# Patient Record
Sex: Female | Born: 1975 | Race: White | Hispanic: No | Marital: Married | Smoking: Former smoker
Health system: Southern US, Community
[De-identification: ages and names within clinical notes are randomized; demographics above are authoritative.]

## PROBLEM LIST (undated history)

## (undated) DIAGNOSIS — I10 Essential (primary) hypertension: Secondary | ICD-10-CM

## (undated) DIAGNOSIS — Z9884 Bariatric surgery status: Secondary | ICD-10-CM

## (undated) DIAGNOSIS — G932 Benign intracranial hypertension: Secondary | ICD-10-CM

## (undated) DIAGNOSIS — E785 Hyperlipidemia, unspecified: Secondary | ICD-10-CM

## (undated) DIAGNOSIS — Z8669 Personal history of other diseases of the nervous system and sense organs: Secondary | ICD-10-CM

## (undated) HISTORY — DX: Benign intracranial hypertension: G93.2

## (undated) HISTORY — DX: Personal history of other diseases of the nervous system and sense organs: Z86.69

## (undated) HISTORY — DX: Hyperlipidemia, unspecified: E78.5

## (undated) HISTORY — DX: Essential (primary) hypertension: I10

## (undated) HISTORY — DX: Bariatric surgery status: Z98.84

---

## 2003-08-16 ENCOUNTER — Other Ambulatory Visit: Admission: RE | Admit: 2003-08-16 | Discharge: 2003-08-16 | Payer: Self-pay | Admitting: Obstetrics and Gynecology

## 2004-03-26 ENCOUNTER — Ambulatory Visit: Payer: Self-pay | Admitting: Internal Medicine

## 2004-05-14 ENCOUNTER — Ambulatory Visit: Payer: Self-pay | Admitting: Internal Medicine

## 2004-05-21 ENCOUNTER — Ambulatory Visit: Payer: Self-pay | Admitting: Internal Medicine

## 2004-08-17 ENCOUNTER — Ambulatory Visit: Payer: Self-pay | Admitting: Internal Medicine

## 2005-04-08 ENCOUNTER — Inpatient Hospital Stay (HOSPITAL_COMMUNITY): Admission: AD | Admit: 2005-04-08 | Discharge: 2005-04-08 | Payer: Self-pay | Admitting: *Deleted

## 2005-04-19 ENCOUNTER — Inpatient Hospital Stay (HOSPITAL_COMMUNITY): Admission: RE | Admit: 2005-04-19 | Discharge: 2005-04-21 | Payer: Self-pay | Admitting: *Deleted

## 2005-12-11 ENCOUNTER — Emergency Department (HOSPITAL_COMMUNITY): Admission: EM | Admit: 2005-12-11 | Discharge: 2005-12-11 | Payer: Self-pay | Admitting: Emergency Medicine

## 2006-03-17 ENCOUNTER — Encounter: Admission: RE | Admit: 2006-03-17 | Discharge: 2006-03-17 | Payer: Self-pay | Admitting: Internal Medicine

## 2006-03-17 ENCOUNTER — Ambulatory Visit: Payer: Self-pay | Admitting: Internal Medicine

## 2006-03-25 ENCOUNTER — Encounter: Payer: Self-pay | Admitting: Internal Medicine

## 2006-04-09 ENCOUNTER — Encounter: Admission: RE | Admit: 2006-04-09 | Discharge: 2006-04-09 | Payer: Self-pay | Admitting: Neurology

## 2006-04-14 ENCOUNTER — Ambulatory Visit (HOSPITAL_COMMUNITY): Admission: RE | Admit: 2006-04-14 | Discharge: 2006-04-14 | Payer: Self-pay | Admitting: Neurology

## 2006-06-10 ENCOUNTER — Ambulatory Visit: Payer: Self-pay | Admitting: Internal Medicine

## 2006-06-12 ENCOUNTER — Encounter: Payer: Self-pay | Admitting: Internal Medicine

## 2006-08-05 ENCOUNTER — Ambulatory Visit: Payer: Self-pay | Admitting: Internal Medicine

## 2006-08-05 LAB — CONVERTED CEMR LAB
Albumin: 3.7 g/dL (ref 3.5–5.2)
Alkaline Phosphatase: 60 units/L (ref 39–117)
BUN: 10 mg/dL (ref 6–23)
Basophils Absolute: 0 10*3/uL (ref 0.0–0.1)
Cholesterol: 141 mg/dL (ref 0–200)
Eosinophils Absolute: 0.1 10*3/uL (ref 0.0–0.6)
GFR calc Af Amer: 108 mL/min
GFR calc non Af Amer: 89 mL/min
HDL: 33.7 mg/dL — ABNORMAL LOW (ref >39.0)
Hemoglobin: 14 g/dL (ref 12.0–15.0)
Lymphocytes Relative: 31.3 % (ref 12.0–46.0)
MCHC: 33.7 g/dL (ref 30.0–36.0)
Monocytes Absolute: 0.5 10*3/uL (ref 0.2–0.7)
Monocytes Relative: 7.5 % (ref 3.0–11.0)
Neutro Abs: 4.2 10*3/uL (ref 1.4–7.7)
Platelets: 234 10*3/uL (ref 150–400)
Potassium: 3.9 meq/L (ref 3.5–5.1)
Total Protein: 6.8 g/dL (ref 6.0–8.3)
Triglycerides: 74 mg/dL (ref 0–149)

## 2006-08-12 ENCOUNTER — Ambulatory Visit: Payer: Self-pay | Admitting: Internal Medicine

## 2006-09-18 ENCOUNTER — Encounter: Payer: Self-pay | Admitting: Internal Medicine

## 2006-10-07 ENCOUNTER — Ambulatory Visit: Payer: Self-pay | Admitting: Family Medicine

## 2006-12-30 ENCOUNTER — Ambulatory Visit: Payer: Self-pay | Admitting: Internal Medicine

## 2006-12-31 LAB — CONVERTED CEMR LAB
Cholesterol: 174 mg/dL (ref 0–200)
HDL: 33.7 mg/dL — ABNORMAL LOW (ref >39.0)
LDL Cholesterol: 115 mg/dL — ABNORMAL HIGH (ref 0–99)
Total CHOL/HDL Ratio: 5.2
Triglycerides: 129 mg/dL (ref 0–149)

## 2007-01-01 DIAGNOSIS — I10 Essential (primary) hypertension: Secondary | ICD-10-CM | POA: Insufficient documentation

## 2007-01-01 DIAGNOSIS — F329 Major depressive disorder, single episode, unspecified: Secondary | ICD-10-CM | POA: Insufficient documentation

## 2007-01-06 ENCOUNTER — Ambulatory Visit: Payer: Self-pay | Admitting: Internal Medicine

## 2007-01-06 DIAGNOSIS — E785 Hyperlipidemia, unspecified: Secondary | ICD-10-CM | POA: Insufficient documentation

## 2007-10-23 ENCOUNTER — Inpatient Hospital Stay (HOSPITAL_COMMUNITY): Admission: AD | Admit: 2007-10-23 | Discharge: 2007-10-26 | Payer: Self-pay | Admitting: Obstetrics and Gynecology

## 2007-10-23 ENCOUNTER — Encounter (INDEPENDENT_AMBULATORY_CARE_PROVIDER_SITE_OTHER): Payer: Self-pay | Admitting: *Deleted

## 2008-01-07 ENCOUNTER — Ambulatory Visit: Payer: Self-pay | Admitting: Internal Medicine

## 2008-01-09 ENCOUNTER — Observation Stay (HOSPITAL_COMMUNITY): Admission: EM | Admit: 2008-01-09 | Discharge: 2008-01-10 | Payer: Self-pay | Admitting: Emergency Medicine

## 2008-01-11 ENCOUNTER — Ambulatory Visit (HOSPITAL_COMMUNITY): Admission: RE | Admit: 2008-01-11 | Discharge: 2008-01-11 | Payer: Self-pay | Admitting: General Surgery

## 2008-01-11 LAB — CONVERTED CEMR LAB
ALT: 20 units/L (ref 0–35)
Alkaline Phosphatase: 63 units/L (ref 39–117)
Bilirubin, Direct: 0.1 mg/dL (ref 0.0–0.3)
CO2: 25 meq/L (ref 19–32)
Calcium: 9.1 mg/dL (ref 8.4–10.5)
Cholesterol: 268 mg/dL (ref 0–200)
Glucose, Bld: 81 mg/dL (ref 70–99)
Hemoglobin: 13.8 g/dL (ref 12.0–15.0)
Lymphocytes Relative: 33.7 % (ref 12.0–46.0)
Monocytes Relative: 6.1 % (ref 3.0–12.0)
Neutro Abs: 4.2 10*3/uL (ref 1.4–7.7)
Potassium: 4 meq/L (ref 3.5–5.1)
RDW: 13.4 % (ref 11.5–14.6)
Sodium: 139 meq/L (ref 135–145)
Total CHOL/HDL Ratio: 6.5
Total Protein: 7.3 g/dL (ref 6.0–8.3)
VLDL: 30 mg/dL (ref 0–40)
WBC: 7.4 10*3/uL (ref 4.5–10.5)

## 2008-01-18 ENCOUNTER — Encounter: Payer: Self-pay | Admitting: Internal Medicine

## 2008-02-04 ENCOUNTER — Telehealth: Payer: Self-pay | Admitting: *Deleted

## 2008-02-21 HISTORY — PX: CHOLECYSTECTOMY: SHX55

## 2008-03-15 ENCOUNTER — Ambulatory Visit: Payer: Self-pay | Admitting: Internal Medicine

## 2008-03-15 DIAGNOSIS — K8020 Calculus of gallbladder without cholecystitis without obstruction: Secondary | ICD-10-CM | POA: Insufficient documentation

## 2008-03-16 ENCOUNTER — Encounter: Payer: Self-pay | Admitting: Internal Medicine

## 2008-03-28 ENCOUNTER — Ambulatory Visit (HOSPITAL_COMMUNITY): Admission: RE | Admit: 2008-03-28 | Discharge: 2008-03-28 | Payer: Self-pay | Admitting: General Surgery

## 2008-03-28 ENCOUNTER — Encounter (INDEPENDENT_AMBULATORY_CARE_PROVIDER_SITE_OTHER): Payer: Self-pay | Admitting: General Surgery

## 2008-04-21 ENCOUNTER — Encounter: Payer: Self-pay | Admitting: Internal Medicine

## 2008-04-28 ENCOUNTER — Encounter: Payer: Self-pay | Admitting: Internal Medicine

## 2008-05-19 ENCOUNTER — Encounter: Payer: Self-pay | Admitting: Internal Medicine

## 2008-08-10 ENCOUNTER — Encounter: Payer: Self-pay | Admitting: Internal Medicine

## 2008-08-16 ENCOUNTER — Encounter: Payer: Self-pay | Admitting: Internal Medicine

## 2008-08-18 ENCOUNTER — Ambulatory Visit: Payer: Self-pay | Admitting: Internal Medicine

## 2008-08-18 DIAGNOSIS — E669 Obesity, unspecified: Secondary | ICD-10-CM | POA: Insufficient documentation

## 2008-08-18 DIAGNOSIS — G932 Benign intracranial hypertension: Secondary | ICD-10-CM | POA: Insufficient documentation

## 2008-08-18 DIAGNOSIS — R252 Cramp and spasm: Secondary | ICD-10-CM | POA: Insufficient documentation

## 2008-08-18 LAB — CONVERTED CEMR LAB
AST: 23 units/L (ref 0–37)
Alkaline Phosphatase: 76 units/L (ref 39–117)
BUN: 9 mg/dL (ref 6–23)
Basophils Absolute: 0 10*3/uL (ref 0.0–0.1)
Basophils Relative: 0.4 % (ref 0.0–3.0)
CO2: 25 meq/L (ref 19–32)
Chloride: 107 meq/L (ref 96–112)
Creatinine, Ser: 0.7 mg/dL (ref 0.4–1.2)
HCT: 40.4 % (ref 36.0–46.0)
Hemoglobin: 14.1 g/dL (ref 12.0–15.0)
Lymphs Abs: 2.5 10*3/uL (ref 0.7–4.0)
MCHC: 34.9 g/dL (ref 30.0–36.0)
Monocytes Relative: 5.6 % (ref 3.0–12.0)
Neutro Abs: 5.7 10*3/uL (ref 1.4–7.7)
RDW: 12.8 % (ref 11.5–14.6)
TSH: 2.32 microintl units/mL (ref 0.35–5.50)
Total Bilirubin: 0.7 mg/dL (ref 0.3–1.2)
Total CHOL/HDL Ratio: 5

## 2008-08-19 ENCOUNTER — Encounter: Payer: Self-pay | Admitting: Internal Medicine

## 2008-08-23 ENCOUNTER — Encounter: Payer: Self-pay | Admitting: Internal Medicine

## 2008-08-25 ENCOUNTER — Telehealth: Payer: Self-pay | Admitting: *Deleted

## 2008-09-13 ENCOUNTER — Ambulatory Visit: Payer: Self-pay | Admitting: Internal Medicine

## 2008-09-13 ENCOUNTER — Telehealth: Payer: Self-pay | Admitting: Internal Medicine

## 2008-09-13 LAB — CONVERTED CEMR LAB
BUN: 12 mg/dL (ref 6–23)
CO2: 23 meq/L (ref 19–32)
GFR calc non Af Amer: 102.09 mL/min (ref >60)
Glucose, Bld: 88 mg/dL (ref 70–99)
Potassium: 3.7 meq/L (ref 3.5–5.1)

## 2008-09-15 ENCOUNTER — Encounter: Payer: Self-pay | Admitting: Internal Medicine

## 2008-09-16 ENCOUNTER — Ambulatory Visit: Payer: Self-pay | Admitting: Internal Medicine

## 2008-09-23 ENCOUNTER — Encounter: Payer: Self-pay | Admitting: Internal Medicine

## 2008-10-07 ENCOUNTER — Ambulatory Visit (HOSPITAL_COMMUNITY): Admission: RE | Admit: 2008-10-07 | Discharge: 2008-10-07 | Payer: Self-pay | Admitting: Surgery

## 2008-10-11 ENCOUNTER — Ambulatory Visit (HOSPITAL_COMMUNITY): Admission: RE | Admit: 2008-10-11 | Discharge: 2008-10-11 | Payer: Self-pay | Admitting: Surgery

## 2008-10-14 ENCOUNTER — Ambulatory Visit: Payer: Self-pay | Admitting: Internal Medicine

## 2008-10-14 DIAGNOSIS — R030 Elevated blood-pressure reading, without diagnosis of hypertension: Secondary | ICD-10-CM | POA: Insufficient documentation

## 2008-10-19 ENCOUNTER — Encounter: Admission: RE | Admit: 2008-10-19 | Discharge: 2009-01-17 | Payer: Self-pay | Admitting: Surgery

## 2008-10-20 ENCOUNTER — Encounter: Payer: Self-pay | Admitting: Internal Medicine

## 2008-10-22 ENCOUNTER — Inpatient Hospital Stay (HOSPITAL_COMMUNITY): Admission: AD | Admit: 2008-10-22 | Discharge: 2008-10-23 | Payer: Self-pay | Admitting: Obstetrics and Gynecology

## 2008-11-18 ENCOUNTER — Ambulatory Visit: Payer: Self-pay | Admitting: Internal Medicine

## 2008-12-16 ENCOUNTER — Ambulatory Visit: Payer: Self-pay | Admitting: Internal Medicine

## 2008-12-16 DIAGNOSIS — E559 Vitamin D deficiency, unspecified: Secondary | ICD-10-CM | POA: Insufficient documentation

## 2009-02-03 ENCOUNTER — Ambulatory Visit: Payer: Self-pay | Admitting: Internal Medicine

## 2009-02-07 LAB — CONVERTED CEMR LAB: Vit D, 25-Hydroxy: 37 ng/mL (ref 30–89)

## 2009-02-09 ENCOUNTER — Encounter: Admission: RE | Admit: 2009-02-09 | Discharge: 2009-04-19 | Payer: Self-pay | Admitting: Surgery

## 2009-03-01 ENCOUNTER — Ambulatory Visit: Payer: Self-pay | Admitting: Internal Medicine

## 2009-03-01 DIAGNOSIS — J351 Hypertrophy of tonsils: Secondary | ICD-10-CM | POA: Insufficient documentation

## 2009-03-01 LAB — CONVERTED CEMR LAB: Rapid Strep: NEGATIVE

## 2009-03-02 ENCOUNTER — Encounter (INDEPENDENT_AMBULATORY_CARE_PROVIDER_SITE_OTHER): Payer: Self-pay | Admitting: *Deleted

## 2009-03-07 ENCOUNTER — Ambulatory Visit (HOSPITAL_COMMUNITY): Admission: RE | Admit: 2009-03-07 | Discharge: 2009-03-07 | Payer: Self-pay | Admitting: Surgery

## 2009-03-07 HISTORY — PX: LAPAROSCOPIC GASTRIC BANDING: SHX1100

## 2009-05-04 ENCOUNTER — Encounter: Admission: RE | Admit: 2009-05-04 | Discharge: 2009-08-02 | Payer: Self-pay | Admitting: Surgery

## 2009-05-11 ENCOUNTER — Encounter: Payer: Self-pay | Admitting: Internal Medicine

## 2009-06-23 ENCOUNTER — Encounter: Payer: Self-pay | Admitting: Internal Medicine

## 2009-07-29 IMAGING — CT CT PELVIS W/ CM
2 of 5 series · 17 of 46 positions shown, 19 images · IV contrast (agent unspecified)
Comparison: None

CT ABDOMEN

CLINICAL DATA: Abdominal pain

CT ABDOMEN AND PELVIS WITH CONTRAST
TECHNIQUE: Multidetector CT imaging of the abdomen and pelvis was
performed using the standard protocol following bolus
administration of intravenous contrast.
Contrast:
100 ml of omni 300

[Series 2: abd_pel 5.0 b40s · axial · 0.84mm/px · z∈[+651,+1066]mm · 14 of 93 slices shown, 16 images]
[im 5/93  soft-tissue]
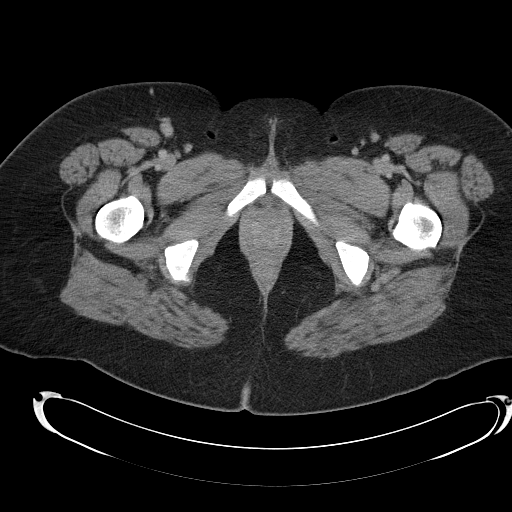
[im 5/93  bone]
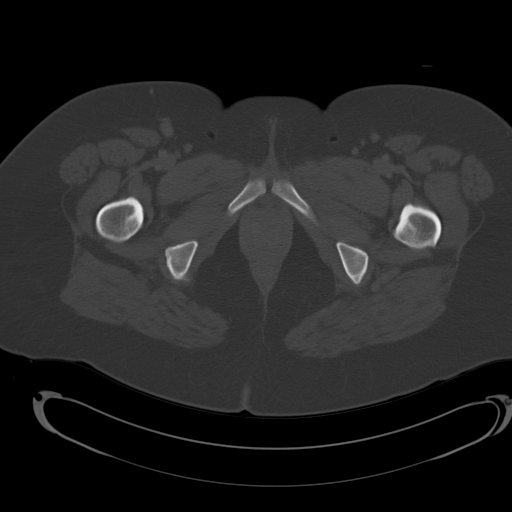
[im 14/93  soft-tissue]
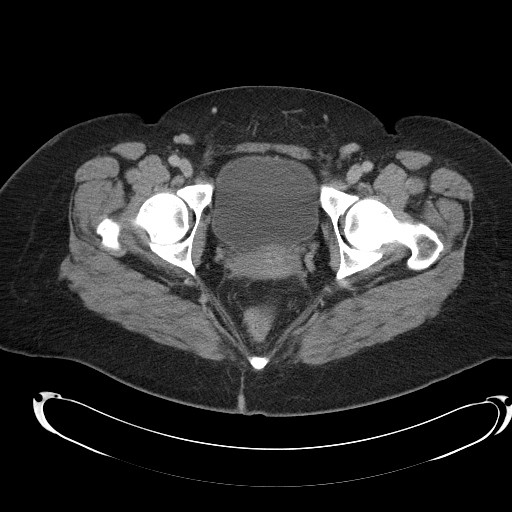
[im 19/93  soft-tissue]
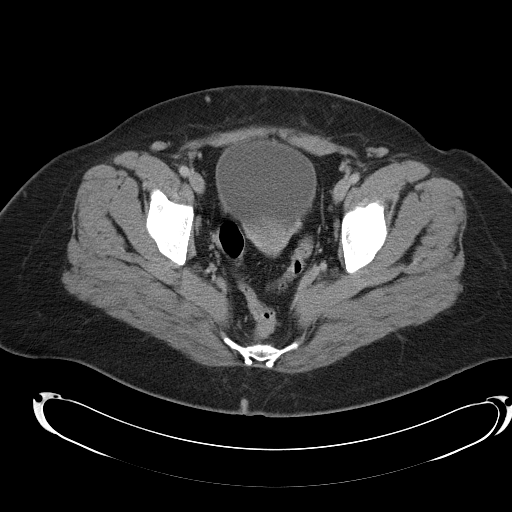
[im 24/93  soft-tissue]
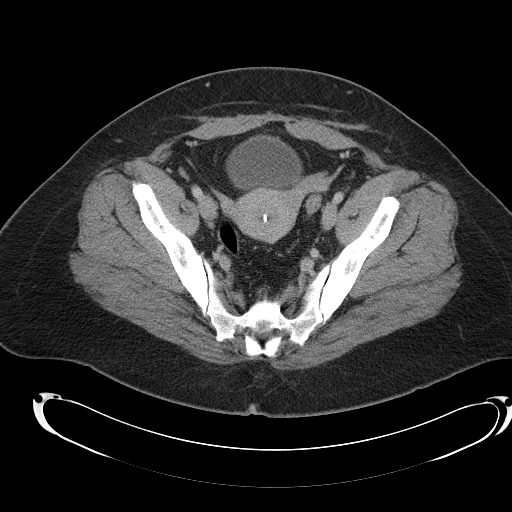
[im 33/93  soft-tissue]
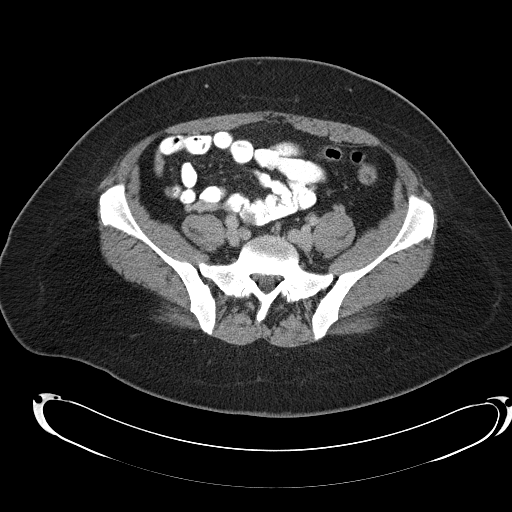
[im 37/93  soft-tissue]
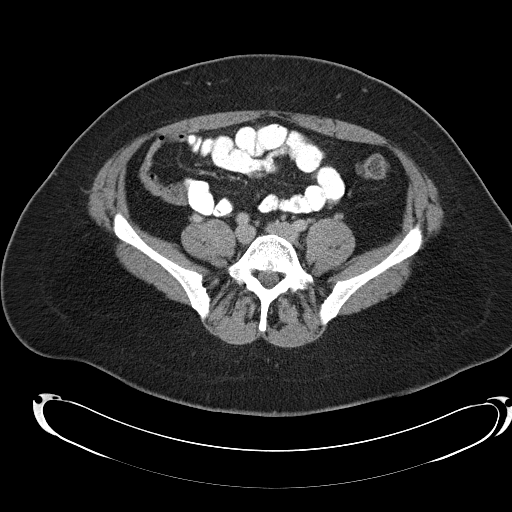
[im 42/93  soft-tissue]
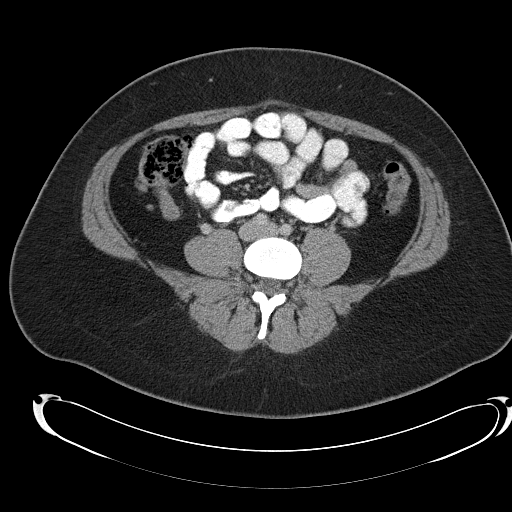
[im 51/93  soft-tissue]
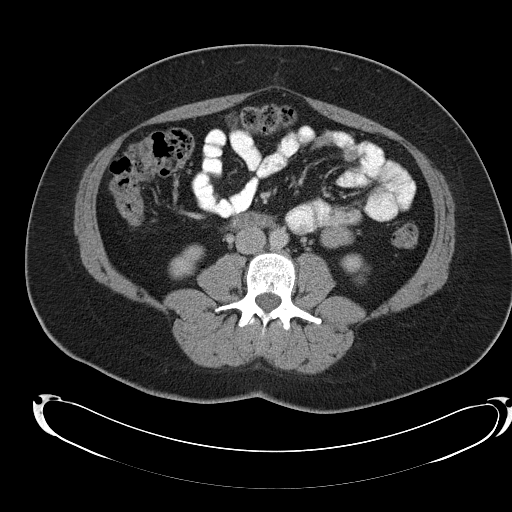
[im 56/93  soft-tissue]
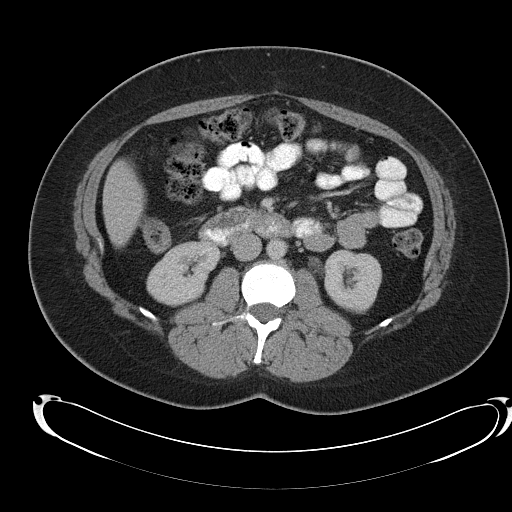
[im 56/93  bone]
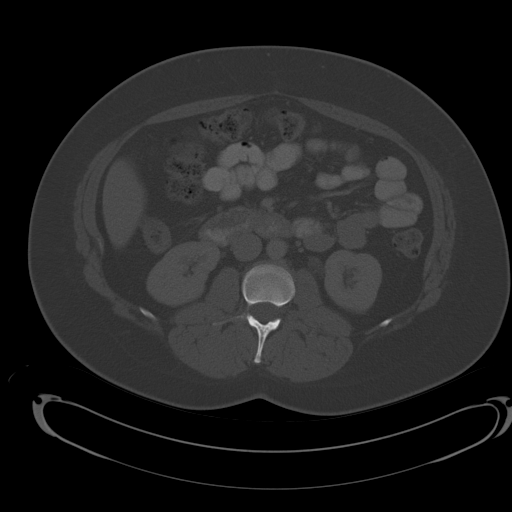
[im 60/93  soft-tissue]
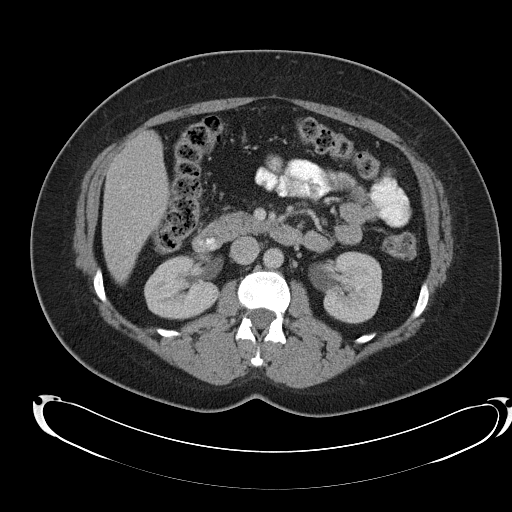
[im 70/93  soft-tissue]
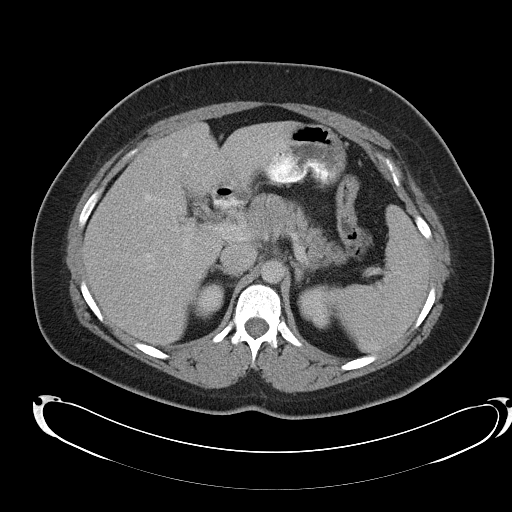
[im 74/93  soft-tissue]
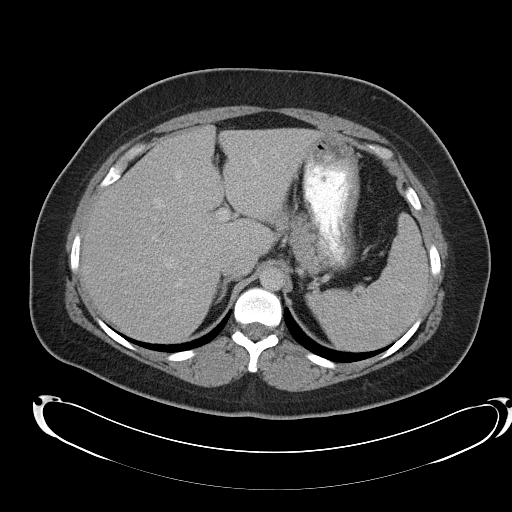
[im 79/93  soft-tissue]
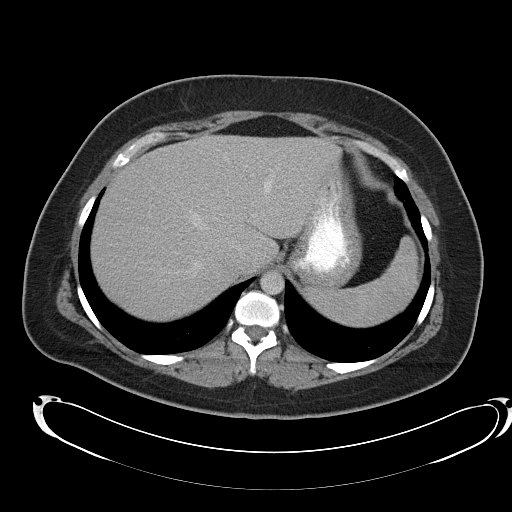
[im 88/93  soft-tissue]
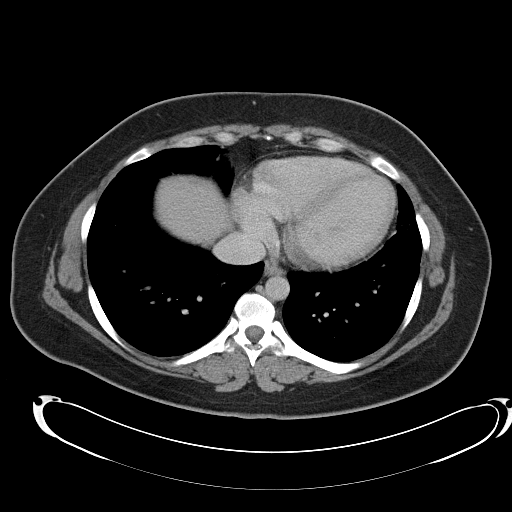

[Series 602: <mpr thick range> · coronal · 0.90mm/px · 3 of 90 slices shown]
[im 30/90  soft-tissue]
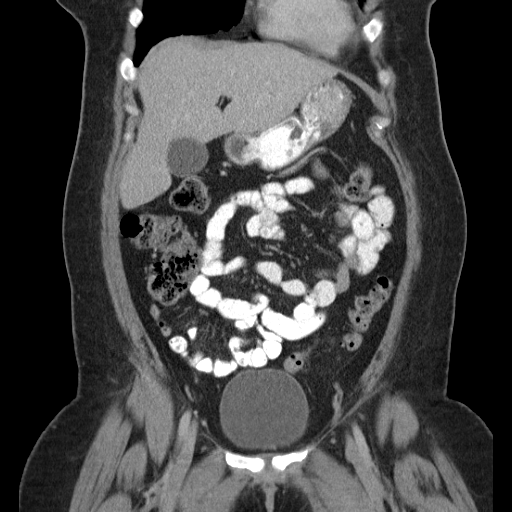
[im 40/90  soft-tissue]
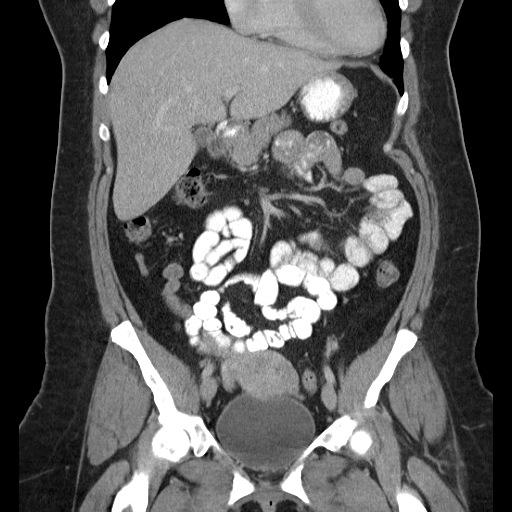
[im 50/90  soft-tissue]
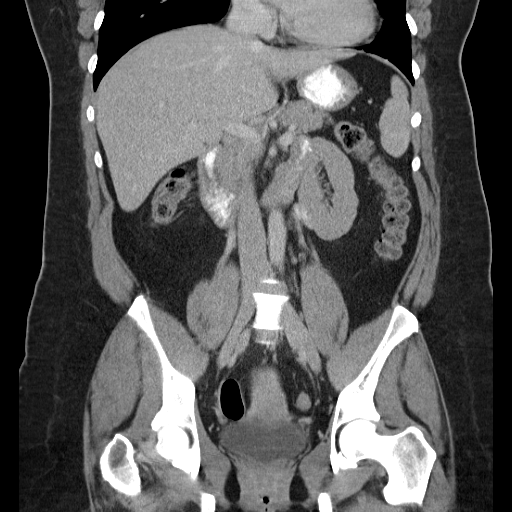

[17 of 46 positions shown; findings below may reference images not displayed]

FINDINGS: The lung bases are clear.

No pericardial or pleural fluid.

The liver parenchyma is normal.

The spleen is normal.

Both of the adrenal glands are normal.

The pancreas is normal.

The right kidney there is normal.

The left kidney is normal.

There is no enlarged retroperitoneal or small bowel mesenteric
lymph nodes.

The bowel loops of the upper abdomen are normal in their course and
caliber.

The appendix appears normal

The visualized osseous structures are unremarkable.
IMPRESSION: 1.  No acute upper abdominal CT findings.
2.  No mass or adenopathy.

CT PELVIS
FINDINGS: The urinary bladder is negative.

An IUD is identified within the uterine cavity.

The pelvic bowel loops are unremarkable.

No free fluid or abnormal fluid collections.

No mass or pelvic adenopathy.

The ventral abdominal and pelvic wall is intact.

No hernia is noted.
IMPRESSION: 1.  No acute pelvic CT findings.

## 2009-11-01 ENCOUNTER — Ambulatory Visit: Payer: Self-pay | Admitting: Internal Medicine

## 2009-11-01 LAB — CONVERTED CEMR LAB: Rapid Strep: POSITIVE

## 2010-01-02 ENCOUNTER — Ambulatory Visit: Payer: Self-pay | Admitting: Internal Medicine

## 2010-01-02 LAB — CONVERTED CEMR LAB
ALT: 12 units/L (ref 0–35)
AST: 16 units/L (ref 0–37)
Albumin: 3.9 g/dL (ref 3.5–5.2)
Alkaline Phosphatase: 42 units/L (ref 39–117)
Bilirubin, Direct: 0.1 mg/dL (ref 0.0–0.3)
Cholesterol: 192 mg/dL (ref 0–200)
LDL Cholesterol: 140 mg/dL — ABNORMAL HIGH (ref 0–99)
Total Protein: 6.5 g/dL (ref 6.0–8.3)
Triglycerides: 57 mg/dL (ref 0.0–149.0)

## 2010-01-05 ENCOUNTER — Encounter: Payer: Self-pay | Admitting: Internal Medicine

## 2010-01-17 ENCOUNTER — Ambulatory Visit: Payer: Self-pay | Admitting: Internal Medicine

## 2010-01-17 DIAGNOSIS — J069 Acute upper respiratory infection, unspecified: Secondary | ICD-10-CM | POA: Insufficient documentation

## 2010-05-14 ENCOUNTER — Encounter: Payer: Self-pay | Admitting: Internal Medicine

## 2010-05-22 NOTE — Medication Information (Signed)
Summary: Potential Medication Nonadherence  Potential Medication Nonadherence   Imported By: Maryln Gottron 01/17/2010 09:24:49  _____________________________________________________________________  External Attachment:    Type:   Image     Comment:   External Document

## 2010-05-22 NOTE — Assessment & Plan Note (Signed)
Summary: SORE THROAT // RS   Vital Signs:  Patient profile:   35 year old female Menstrual status:  iud Height:      66.75 inches Weight:      187 pounds BMI:     29.61 Temp:     98.6 degrees F oral Pulse rate:   66 / minute BP sitting:   120 / 80  (right arm) Cuff size:   regular  Vitals Entered By: Romualdo Bolk, CMA (AAMA) (November 01, 2009 9:13 AM) CC: Sore throat that started on 7/12, tonsils swollen that is causing ears to hurt. No fever, no coughing or congestion.    History of Present Illness: [name] comes in today  for acute visit .   child was sick  with fever  and then throat test was negative.   culture.  less than a weeka ago.  Now has 1-2 days of above  and hurts to swallow.   Lasts unilateral tonsil infection was in the fall and got better with antibioitc.       had LAp band then and losing weight well.  No HAs and off med for  [facility].  need follow up for this   has been  on lipitor > need to continue?   Preventive Screening-Counseling & Management  Alcohol-Tobacco     Alcohol drinks/day: <1     Alcohol type: wine     Smoking Status: quit     Year Quit: 4 years ago  Caffeine-Diet-Exercise     Caffeine use/day: 2     Does Patient Exercise: yes     Type of exercise: water aerobics, wt, eliptical     Exercise (avg: min/session): 30-60     Times/week: 3-4  Current Medications (verified): 1)  Lipitor 20 Mg Tabs (Atorvastatin Calcium) .Marland Kitchen.. 1 By Mouth Once Daily 2)  Multivitamins   Tabs (Multiple Vitamin) 3)  Mirena 20 Mcg/24hr Iud (Levonorgestrel)  Allergies (verified): 1)  Codeine Phosphate (Codeine Phosphate)  Past History:  Past medical, surgical, family and social histories (including risk factors) reviewed, and no changes noted (except as noted below).  Past Medical History: Reviewed history from 08/18/2008 and no changes required. High Cholesterol Hypertension Depression psuedotumor  cerebri Had been on doxycycline   2007  Dr  Sandria Manly   Gallstones to have surgery Dec 7th  Past Surgical History: Tonsillectomy adenoids Caesarean section x2  Cholecystectomy 03/27/08 Lap Band 03/07/2009  Family History: Reviewed history from 08/18/2008 and no changes required. Family History of Alcoholism/Addiction Family History Hypertension Family History Psychiatric care Family History of Stroke M 1st degree relative <50 Family History of Cardiovascular disorder  father Mi 101 and stents    Social History: Reviewed history from 11/18/2008 and no changes required. 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  The patient denies weight gain, chest pain, dyspnea on exertion, prolonged cough, abdominal pain, melena, hematochezia, transient blindness, difficulty walking, depression, abnormal bleeding, and angioedema.         no v or d   Physical Exam  General:  milldy ill in nad non toxic  Head:  normocephalic and atraumatic.   Eyes:  PERRL, EOMs full, conjunctiva clear  Ears:  R ear normal and L ear normal. R  ear normal, L ear normal, and no external deformities.   Nose:  no external deformity, no external erythema, and no nasal discharge.   Mouth:  tonsil 3+ with slight edema and exudate early  Neck:  tender ac nodes neg pc  Lungs:  Normal respiratory effort, chest expands symmetrically. Lungs are clear to auscultation, no crackles or wheezes. Heart:  Normal rate and regular rhythm. S1 and S2 normal without gallop, murmur, click, rub or other extra sounds. Abdomen:  Bowel sounds positive,abdomen soft and non-tender without masses, organomegaly or  noted. Pulses:  nl cap refill  Neurologic:  nonf ocal  Skin:  turgor normal, color normal, and no rashes.   Cervical Nodes:  no  posterior cervical adenopathy, R anterior LN tender, and L anterior LN tender.   Psych:  Oriented X3, good eye contact, not anxious appearing, and not depressed appearing.     Impression & Recommendations:  Problem # 1:  TONSILLITIS, STREPTOCOCCAL (ICD-034.0)  disc rx and mangement         Her updated medication list for this problem includes:    Amoxicillin 500 Mg Caps (Amoxicillin) .Marland Kitchen... 1 by mouth two times a day for 10 days  Instructed to complete antibiotics and call if not improved in 48 hours.   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 1 by mouth once daily  Problem # 5:  BARIATRIC SURGERY STATUS/LAP BAND 11/10 (ICD-V45.86)  Complete Medication List: 1)  Lipitor 20 Mg Tabs (Atorvastatin calcium) .Marland Kitchen.. 1 by mouth once daily 2)  Multivitamins Tabs (Multiple vitamin) 3)  Mirena 20 Mcg/24hr Iud (Levonorgestrel) 4)  Amoxicillin 500 Mg Caps (Amoxicillin) .Marland Kitchen.. 1 by mouth two times a day for 10 days  Other Orders: Rapid Strep (21308) T-Culture, Throat (65784-69629)  Patient Instructions: 1)  your strep test is positive  2)  take all medication 3)  call if not getting beter in 2-3 days. 4)  schedule for lipiid ,  lfts,  and vit d ,  bmp.    for 2 months  5)  and then ROV  Prescriptions: AMOXICILLIN 500 MG CAPS (AMOXICILLIN) 1 by mouth two times a day for 10 days  #20 x 0   Entered and Authorized by:   Madelin Headings MD   Signed by:   Madelin Headings MD on 11/01/2009   Method used:   Electronically to        [facility]  [phone]*  (retail)       [address] [place], [place]  [address]       Ph: [phone] or [phone]       Fax: [phone]   RxID:   [id number]   Laboratory Results  Date/Time Received: November 01, 2009 9:33 AM  Date/Time Reported: November 01, 2009 9:33 AM   Other Tests  Rapid Strep: positive Comments Wynona Canes, CMA  November 01, 2009 9:34 AM

## 2010-05-22 NOTE — Letter (Signed)
Summary: [facility]   Imported By: Maryln Gottron 05/29/2009 12:53:16  _____________________________________________________________________  External Attachment:    Type:   Image     Comment:   External Document

## 2010-05-22 NOTE — Assessment & Plan Note (Signed)
Summary: 2 MONTH ROV/NJR/PT RSC/CJR   Vital Signs:  Patient profile:   35 year old female Menstrual status:  iud Weight:      177 pounds BMI:     28.03 Pulse rate:   66 / minute BP sitting:   120 / 80  (left arm) Cuff size:   regular  Vitals Entered By: Romualdo Bolk, CMA (AAMA) (January 17, 2010 11:12 AM) CC: Follow-up visit on labs, Hypertension Management   History of Present Illness: [name]   comesin for follow up of  labs and  bp lipid status .  Since last visit  She is doing well and about  12 pounds from goal weight.   exercising. still off     meds  vit d and  lipds  to check today   has a uri today  congestion no fever some cough . No pain .   Hypertension History:      She denies headache, chest pain, palpitations, dyspnea with exertion, orthopnea, PND, peripheral edema, visual symptoms, neurologic problems, syncope, and side effects from treatment.  She notes no problems with any antihypertensive medication side effects.        Positive major cardiovascular risk factors include hyperlipidemia and hypertension.  Negative major cardiovascular risk factors include female age less than 30 years old and non-tobacco-user status.     Preventive Screening-Counseling & Management  Alcohol-Tobacco     Alcohol drinks/day: <1     Alcohol type: wine     Smoking Status: quit     Year Quit: 4 years ago  Caffeine-Diet-Exercise     Caffeine use/day: 2     Does Patient Exercise: yes     Type of exercise: water aerobics, wt, eliptical     Exercise (avg: min/session): 30-60     Times/week: 3-4  Current Medications (verified): 1)  Multivitamins   Tabs (Multiple Vitamin) 2)  Mirena 20 Mcg/24hr Iud (Levonorgestrel) 3)  Calcium Carbonate-Vitamin D 600-400 Mg-Unit  Tabs (Calcium Carbonate-Vitamin D)  Allergies (verified): 1)  Codeine Phosphate (Codeine Phosphate)  Past History:  Past medical, surgical, family and social histories (including risk factors)  reviewed, and no changes noted (except as noted below).  Past Medical History: Reviewed history from 08/18/2008 and no changes required. High Cholesterol Hypertension Depression psuedotumor  cerebri Had been on doxycycline   2007  Dr Sandria Manly   Gallstones to have surgery Dec 7th  Past Surgical History: Reviewed history from 11/01/2009 and no changes required. Tonsillectomy adenoids Caesarean section x2  Cholecystectomy 03/27/08 Lap Band 03/07/2009  Past History:  Care Management: Surgery: Dr. Derrell Lolling Neurology: Dr. Karleen Hampshire (Neuro opth) and Dr. Sandria Manly Gynecology: [facility] Psychiatry: Cyndia Skeeters Nutrionists: [facility]  Family History: Reviewed history from 08/18/2008 and no changes required. Family History of Alcoholism/Addiction Family History Hypertension Family History Psychiatric care Family History of Stroke M 1st degree relative <50 Family History of Cardiovascular disorder  father Mi 30 and stents    Social History: Reviewed history from 11/18/2008 and no changes required. 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   Eyes:  vision grossly intact.   Nose:  mild congestion  Mouth:  tonsil 1 + no exudate redness or edema Neck:  No deformities, masses, or tenderness noted. Lungs:  Normal respiratory effort, chest expands symmetrically. Lungs are clear to auscultation, no crackles or wheezes. Heart:  Normal rate and regular rhythm. S1 and S2 normal without gallop, murmur, click, rub or other extra sounds. Pulses:  pulses intact without delay   Extremities:  no clubbing cyanosis  or edema  Neurologic:  non f ocal  Skin:  turgor normal and color normal.   Cervical Nodes:  No lymphadenopathy noted   Impression & Recommendations:  Problem # 1:  HYPERLIPIDEMIA (ICD-272.4) Assessment Improved ok to stay off meds  and do ..lsi   originally her ldl was 218 and then rx to 130 range ldl  and now 140  with no meds and weight loss.  The following medications were removed from the medication list:    Lipitor 20 Mg Tabs (Atorvastatin calcium) .Marland Kitchen... 1 by mouth once daily  Labs Reviewed: SGOT: 16 (01/02/2010)   SGPT: 12 (01/02/2010)  Lipid Goals: Chol Goal: 200 (11/18/2008)   HDL Goal: 40 (11/18/2008)   LDL Goal: 130 (11/18/2008)   TG Goal: 150 (11/18/2008)  10 Yr Risk Heart Disease: 1 % Prior 10 Yr Risk Heart Disease: Not enough information (11/18/2008)   HDL:40.80 (01/02/2010), 39.40 (08/18/2008)  LDL:140 (01/02/2010), DEL (01/07/2008)  Chol:192 (01/02/2010), 204 (08/18/2008)  Trig:57.0 (01/02/2010), 160.0 (08/18/2008)  Problem # 2:  HYPERTENSION (ICD-401.9) resolved  with weight loss  Problem # 3:  BARIATRIC SURGERY STATUS/LAP BAND 11/10 (ICD-V45.86) resolved obesity now bmi in 27-28 range   doing very welll  with lifestyle intervention   Problem # 4:  URI (ICD-465.9) viral no secondary infection seen.  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  Her calculated 10 year risk of coronary heart disease is 1 %.  Today's blood pressure is 120/80.  Her blood pressure goal is < 140/90.  Patient Instructions: 1)  ok to  stay off meds  and continue lifestyle intervention  . 2)  check up and labs in  a year  or as needed. Flu Vaccine Consent Questions     Do you have a history of severe allergic reactions to this vaccine? no    Any prior history of allergic reactions to egg and/or gelatin? no    Do you have a sensitivity to the preservative Thimersol? no    Do you have a past history of Guillan-Barre Syndrome? no    Do you currently have an acute febrile illness? no    Have you ever had a severe reaction to latex? no    Vaccine information given and explained to patient? yes    Are you currently pregnant? no    Lot Number:[id number]   Exp Date:10/20/2010   Site Given  Left Deltoid IMlu Romualdo Bolk, CMA (AAMA)  January 17, 2010 11:15 AM

## 2010-05-22 NOTE — Letter (Signed)
Summary: [facility]   Imported By: Maryln Gottron 07/12/2009 12:22:31  _____________________________________________________________________  External Attachment:    Type:   Image     Comment:   External Document

## 2010-07-25 LAB — COMPREHENSIVE METABOLIC PANEL
AST: 19 U/L (ref 0–37)
BUN: 16 mg/dL (ref 6–23)
CO2: 23 mEq/L (ref 19–32)
Chloride: 108 mEq/L (ref 96–112)
Creatinine, Ser: 0.7 mg/dL (ref 0.4–1.2)
GFR calc non Af Amer: >60 mL/min (ref >60)
Total Bilirubin: 0.5 mg/dL (ref 0.3–1.2)

## 2010-07-25 LAB — CBC
HCT: 39 % (ref 36.0–46.0)
MCV: 88.1 fL (ref 78.0–100.0)
RBC: 4.43 MIL/uL (ref 3.87–5.11)
WBC: 8.6 10*3/uL (ref 4.0–10.5)

## 2010-07-25 LAB — DIFFERENTIAL
Basophils Absolute: 0.1 10*3/uL (ref 0.0–0.1)
Eosinophils Relative: 1 % (ref 0–5)
Lymphocytes Relative: 29 % (ref 12–46)
Neutro Abs: 5.5 10*3/uL (ref 1.7–7.7)

## 2010-07-25 LAB — PREGNANCY, URINE: Preg Test, Ur: NEGATIVE

## 2010-07-29 LAB — URINALYSIS, ROUTINE W REFLEX MICROSCOPIC
Bilirubin Urine: NEGATIVE
Nitrite: NEGATIVE
Protein, ur: NEGATIVE mg/dL
Specific Gravity, Urine: 1.02 (ref 1.005–1.030)
Urobilinogen, UA: 0.2 mg/dL (ref 0.0–1.0)

## 2010-09-04 NOTE — H&P (Signed)
[name], [name]             ACCOUNT NO.:  [id number]   MEDICAL RECORD NO.:  [id number]          PATIENT TYPE:  OBV   LOCATION:  1533                         FACILITY:  [facility]   PHYSICIAN:  Angelia Mould. Derrell Lolling, M.D.DATE OF BIRTH:  03-03-1976   DATE OF ADMISSION:  01/09/2008  DATE OF DISCHARGE:  01/10/2008                              HISTORY & PHYSICAL   CHIEF COMPLAINT:  Epigastric pain, nausea and vomiting.   HISTORY OF PRESENT ILLNESS:  This is a 35 year old white female who is  11 weeks postpartum from an uncomplicated C-section.  2 weeks ago she  developed epigastric pain radiating to her back, repeated bouts of  nausea and vomiting which lasted about 5 hours.  This then resolved.  She subsequently saw Dr. Berniece Andreas and  was scheduled to get an  ultrasound next week.  Today, after lunch, she developed the onset again  of epigastric pain and back pain and nausea.  She still feels the pain,  still feels nauseated.   I was called by Dr. Beather Arbour which the patient identifies as her  uncle and she was instructed to go to [facility] emergency room, and I  am admitting her through the emergency room.  Gallbladder ultrasound is  pending.   PAST HISTORY:  Gravida 2, para 2, C-section x2.  She had preeclampsia  which resolved.  She had a reaction to doxycycline which resulted in  pseudotumor cerebri, and this was diagnosed to managed by Dr. Avie Echevaria.   MEDICATIONS:  None.   DRUG ALLERGIES:  DOXYCYCLINE.   SOCIAL HISTORY:  She is married.  They live in [place].  They have  two children.  She denies the use of tobacco.  Drinks alcohol just  occasionally.  She is a housewife.  Her father is [name], who is a  retired International aid/development worker here in town.   FAMILY HISTORY:  Mother living and well.  Father living has coronary  artery disease.  Has had an MRI and hyperlipidemia.  She has three half  siblings.   REVIEW OF SYSTEMS:  A 15-system review of systems is performed  and is  noncontributory except as described above.   PHYSICAL EXAMINATION:  GENERAL:  Pleasant, cooperative, somewhat  overweight white female in mild to moderate distress.  Her husband is  present throughout the encounter.  VITAL SIGNS:  Temperature 97.6, pulse 71, respirations 18, blood  pressure 138/97.  HEENT:  Eyes:  Sclerae clear.  Extraocular is intact.  Ears, Mouth,  Throat, Nose, Lips, Tongue and Oropharynx:  Without gross lesions.  NECK:  Supple, nontender.  No mass.  No jugular distention.  LUNGS:  Clear to auscultation.  No chest wall tenderness other than some  right anterior subcostal tenderness to percussion.  BREASTS:  Not  examined.  ABDOMEN:  She is somewhat overweight.  She is tender with involuntary  guarding in the right upper quadrant.  There is no mass.  She is soft  elsewhere.  Well healed C-section scar in the lower abdomen.  No hernias  noted.  No masses noted.  Liver not enlarged. GENITOURINARY:  There is  no inguinal adenopathy or mass or hernia detectable in supine.  EXTREMITIES:  She moves all four extremities well without pain or  deformity.  NEUROLOGIC:  No gross motor sensory deficits.   STUDIES:  Data order and reviewed.  Ultrasound is ordered and pending.  White blood cell count 11,600.  Liver function tests normal.  Amylase  and lipase normal.  Urine pregnancy test negative.   ASSESSMENT:  1. Upper abdominal pain, suspect acute cholecystitis.  2. Status post cesarean section x2.  3. Eleven weeks postpartum.  She is not breast-feeding.   PLAN:  1. The patient will be admitted for further evaluation and management.  2. Intravenous analgesics and antiemetics have been ordered.  3. We will proceed with an ultrasound now and reevaluate after that is      done.      Angelia Mould. Derrell Lolling, M.D.  Electronically Signed     HMI/MEDQ  D:  01/09/2008  T:  01/11/2008  Job:  [id number]   cc:   Alexis Mends. Fabian Sharp, MD  [address] [place], [place]  [address]

## 2010-09-04 NOTE — Assessment & Plan Note (Signed)
[facility]                                 ON-CALL NOTE   [name], [name]                      MRN:          [id number]  DATE:01/09/2008                            DOB:          04/28/1975    January 09, 2008, at 3 p.m.   PHONE NUMBER:  [phone].  Dr. Fabian Sharp is the regular doctor.  Dr. Milinda Antis  on call.   CHIEF COMPLAINT:  Abdominal pain.   The patient said 2 weeks ago, she had an episode of severe abdominal  pain with nausea, vomiting, and diarrhea.  She then followed up with Dr.  Fabian Sharp last Thursday and was told this might be a gallbladder problem.  She has a gallbladder ultrasound planned fornext week but her pain has  returned.  She is having moderate-to-severe right-sided abdominal pain  and also continues to have some nausea but has not vomited yet.  I  instructed her to go to the emergency room for evaluation now and  that  is what she is going to do.     Marne A. Tower, MD  Electronically Signed    MAT/MedQ  DD: 01/09/2008  DT: 01/09/2008  Job #: [id number]

## 2010-09-04 NOTE — Op Note (Signed)
NAMECANDEE, HOON             ACCOUNT NO.:  [id number]   MEDICAL RECORD NO.:  [id number]          PATIENT TYPE:  AMB   LOCATION:  DAY                          FACILITY:  [facility]   PHYSICIAN:  Angelia Mould. Derrell Lolling, M.D.DATE OF BIRTH:  [birth year]   DATE OF PROCEDURE:  03/28/2008  DATE OF DISCHARGE:                               OPERATIVE REPORT   PREOPERATIVE DIAGNOSIS:  Chronic cholecystitis and biliary dyskinesia.   POSTOPERATIVE DIAGNOSIS:  Chronic cholecystitis with cholelithiasis.   OPERATION PERFORMED:  Laparoscopic cholecystectomy.   SURGEON:  Dr. Claud Kelp.   ASSISTANT:  Dr. Emelia Loron.   OPERATIVE INDICATIONS:  This is a 35 year old white female who had a C-  section back in June of this year.  In September she developed  epigastric pain radiating to her back with nausea and vomiting, which  resolved.  She had another episode and was hospitalized.  During that  hospitalization she had a gallbladder ultrasound which was said to be  inconclusive but on the final dictation, they stated the gallbladder  wall was thickened and there was positive sonographic Murphy's sign,  suggesting acalculous cholecystitis.  She subsequently had a nuclear  medicine biliary imaging study which showed that the gallbladder filled  but the ejection fraction was markedly diminished at 5%.  She also had a  CT scan of the abdomen and pelvis in September which was normal.  I  evaluated her as an outpatient several weeks ago.  She was offered  cholecystectomy and she is brought to operating room electively.   OPERATIVE FINDINGS:  The gallbladder appeared chronically inflamed in  that it was discolored but thin-walled.  There were stones in the cystic  duct.  I was unable to cannulate the cystic duct despite multiple  attempts and since her liver function tests were normal and her bile  duct was not dilated, we abandoned attempts at cholangiography.  Her  liver was healthy.  Stomach,  duodenum, small intestine, large intestine  were grossly normal to inspection.  She had a few adhesions to the  anterior abdominal wall in the lower midline, presumably related to her  Pfannenstiel incision.   OPERATIVE TECHNIQUE:  Following induction of general endotracheal  anesthesia, the patient's abdomen was prepped, draped in sterile  fashion.  0.5% Marcaine with epinephrine was used as a local  infiltration anesthetic.  A time-out was held.  The patient was  identified as correct patient, correct procedure.  Intravenous  antibiotics were given.  A vertically oriented incision was made at the  lower rim of the umbilicus.  The fascia was incised in the midline and  the abdominal cavity entered under direct vision.  A 10-mm Hassan trocar  was inserted and secured with a pursestring suture of 0 Vicryl.  Pneumoperitoneum was created.  Video cam was inserted with visualization  and findings as described above.  An 11 mm trocar was placed in the  subxiphoid region and two 5 mm trocars placed in the right upper  quadrant.  The gallbladder fundus was identified and grasped and  elevated.  The infundibulum was identified and retracted laterally.  We  dissected the peritoneum off of the lower part of the gallbladder and  isolated the cystic duct and cystic artery.  The cystic artery was  isolated as it went onto the wall of the gallbladder, secured with  multiple metal clips and divided.  We then created a large window behind  the cystic duct and isolated a fairly significant length of the cystic  duct.  We made multiple attempts to insert a cholangiogram catheter.  We  were unable to get flow with the first incision.  We milked the cystic  duct and got out several small right yellow stones.  We then could not  get good flow.  We then made a lower cut on the cystic duct and we could  not cannulate this area for unknown reason.  We did not find a stone.  At this point we will elected to  abandon the cholangiogram.  We secured  the cystic duct with multiple metal clips and divided it.  The  gallbladder was dissected from its bed with electrocautery, placed in  the specimen bag and removed.  The operative field was copiously  irrigated with saline.  Hemostasis in the bed of the gallbladder was  excellent and achieved with electrocautery.  At the completion of the  case, the irrigation fluid was completely clear.  There was no bleeding  and no bile leak whatsoever from the operative field.  Irrigation fluid  was removed.  Trocars were removed under direct vision.  There was no  bleeding from trocar sites.  Pneumoperitoneum was released.  Fascia at  the umbilicus closed with 0 Vicryl sutures and skin closed with a  subcuticular sutures of 4-0 Monocryl and Dermabond.  Clean bandages were  placed and the patient taken recovery room in stable condition.  Estimated blood loss was about 10-20 mL.  Complications none.  Sponge,  needle and instrument counts were correct.      Angelia Mould. Derrell Lolling, M.D.  Electronically Signed     HMI/MEDQ  D:  03/28/2008  T:  03/28/2008  Job:  [id number]   cc:   Neta Mends. Fabian Sharp, MD  [address] [place], [place] [address]   Gretta Cool, M.D.  Fax: [phone]

## 2010-09-04 NOTE — Op Note (Signed)
[name], [name]             ACCOUNT NO.:  [id number]   MEDICAL RECORD NO.:  [id number]          PATIENT TYPE:  INP   LOCATION:  9157                          FACILITY:  [facility]   PHYSICIAN:  Gerri Spore B. Earlene Plater, M.D.  DATE OF BIRTH:  1975/10/20   DATE OF PROCEDURE:  10/23/2007  DATE OF DISCHARGE:                               OPERATIVE REPORT   PREOPERATIVE DIAGNOSES:  1. A 38-week intrauterine pregnancy.  2. Chronic hypertension.  3. Possible superimposed preeclampsia.   POSTOPERATIVE DIAGNOSES:  1. A 38-week intrauterine pregnancy.  2. Chronic hypertension.  3. Possible superimposed preeclampsia.   PROCEDURE:  Repeat low-transverse cesarean section.   SURGEON:  Chester Holstein. Earlene Plater, MD   ASSISTANT:  Maxie Better, MD   ANESTHESIA:  Spinal.   FINDINGS:  Viable female, 7 pounds 1 ounce, 8 and 9 Apgars.  Normal-  appearing uterus, tubes, and ovaries.   SPECIMENS:  None.   BLOOD LOSS:  500 mL.   COMPLICATIONS:  None.   INDICATIONS:  The patient with above history.  Blood pressure is  typically no higher than 140s over 80s to 90s throughout her pregnancy.  Today, she noticed headache and increased blood pressure at home as high  as 150/100.  In maternity admissions, her initial blood pressure was  170/113 and subsequent blood pressures in the 150-160/100 range.  All  labs were normal including no protein on urine dip.  However, given the  sudden increase in blood pressure into severe range and associated  symptoms I recommended delivery.   PROCEDURE:  The patient was taken to the operating room where spinal  anesthesia was obtained.  Prepped and draped in standard fashion.  Foley  catheter was inserted into the bladder.  A Pfannenstiel incision was  made through the previous scar, carried sharply to the fascia.  The  fascia was divided sharply.  Posterior sheath and peritoneum were  elevated and entered sharply, extended laterally bluntly.  Bladder blade  was inserted.   An uterine incision was made in low transverse fashion  sharply and extended laterally bluntly.  Clear fluid at the amniotomy.  Vertex elevated through the incision and delivered with fundal pressure  without difficulty.  Nose and mouth were suctioned with a bulb and the  remainder of the infant delivered without difficulty.  Cord was clamped  and cut.  Infant handed off to the waiting pediatricians.  Ancef 2 g IV  had been given prior to skin incision.   Uterus was massaged to expel the placenta.  It was then exteriorized and  cleared of all clots and debris.  Uterine incision was closed with a  running lock stitch of 0-chromic with hemostasis obtained.   Uterus was returned to the abdomen.  The uterine incision reinspected  and was hemostatic.  Fascia closed with 0-Vicryl.  Subcutaneous tissue  closed with 2-0 plain suture.  Skin closed with staples.   The patient tolerated the procedure well with no complications.  She was  taken to the recovery room in awake and stable condition.  All counts  were correct per the operating room staff.  Gerri Spore B. Earlene Plater, M.D.  Electronically Signed     WBD/MEDQ  D:  10/23/2007  T:  10/24/2007  Job:  [id number]

## 2010-09-07 NOTE — Discharge Summary (Signed)
[name], [name]             ACCOUNT NO.:  [id number]   MEDICAL RECORD NO.:  [id number]          PATIENT TYPE:  INP   LOCATION:  9124                          FACILITY:  [facility]   PHYSICIAN:  Lenoard Aden, M.D.DATE OF BIRTH:  Nov 10, 1975   DATE OF ADMISSION:  10/23/2007  DATE OF DISCHARGE:  10/26/2007                               DISCHARGE SUMMARY   ADMISSION DIAGNOSES:  1. A 38-week intrauterine pregnancy.  2. Chronic hypertension.  3. Possible superimposed preeclampsia.  4. Previous cesarean section.   DISCHARGE DIAGNOSES:  1. A 38-week intrauterine pregnancy.  2. Chronic hypertension.  3. Possible superimposed preeclampsia.  4. Previous cesarean section.   PROCEDURE:  Repeat low transverse C-section.   HISTORY OF PRESENT ILLNESS:  A 35 year old white female 38 weeks,  history of chronic hypertension, presented with sudden increase in blood  pressure 170s over 110s range with associated headache.  Preeclampsia  labs were normal, but given the sudden change in blood pressure, I  recommended delivery.   HOSPITAL COURSE:  The patient was admitted and underwent a repeat low  transverse C-section.  Findings at the time of surgery included viable  female, Apgars were 8 and 9, and weight 7 pounds 1 ounce.  Normal-  appearing uterus, tubes, and ovaries.   Postoperatively, the patient rapidly regained her ability to ambulate,  void, and tolerate regular diet.  She was maintained on magnesium  sulfate for 24 hours for seizure prophylaxis.  She was ultimately  discharged home on third postoperative day in satisfactory condition.  Follow up 1 week for blood pressure check.  Discharge instructions per  booklet.   DISCHARGE MEDICATIONS:  Tylox 1-2 p.o. q.4 h. p.r.n. pain.   DISPOSITION AT DISCHARGE:  Satisfactory.      Lenoard Aden, M.D.  Electronically Signed     RJT/MEDQ  D:  11/20/2007  T:  11/21/2007  Job:  [id number]

## 2010-09-07 NOTE — Op Note (Signed)
[name], [name]             ACCOUNT NO.:  [id number]   MEDICAL RECORD NO.:  [id number]          PATIENT TYPE:  INP   LOCATION:  9103                          FACILITY:  [facility]   PHYSICIAN:  Gerri Spore B. Earlene Plater, M.D.  DATE OF BIRTH:  07/02/1975   DATE OF PROCEDURE:  04/19/2005  DATE OF DISCHARGE:                                 OPERATIVE REPORT   PREOPERATIVE DIAGNOSES:  1.  Intrauterine pregnancy 38-5/7 weeks.  2.  Persistent breech presentation, status post attempt at version.   POSTOPERATIVE DIAGNOSES:  1.  Intrauterine pregnancy 38-5/7 weeks.  2.  Persistent breech presentation, status post attempt at version.   OPERATION/PROCEDURE:  Primary low transverse cesarean section.   SURGEON:  Chester Holstein. Earlene Plater, M.D.   ASSISTANT:  Lenoard Aden, M.D.   ANESTHESIA:  Spinal.   FINDINGS:  Viable female.  Apgars 9 and 9.  Weight 7 pounds 4 ounces.  Double-  footling breech presentation.  Normal-appearing uterus, tubes and ovaries.   ESTIMATED BLOOD LOSS:  700.   COMPLICATIONS:  None.   OPERATIVE FINDINGS:   INDICATIONS:  The patient is status post unsuccessful attempt at version.  Persistent breech presentation at essentially 39 weeks with history of  chronic hypertension, increasing nausea and vomiting with normal PIH labs  yesterday.  I recommended we proceed with cesarean section.  Risks were  discussed including inferior, bleeding, damage to surrounding organs.   DESCRIPTION OF PROCEDURE:  The patient was taken to the operating room and  spinal anesthesia was obtained.  She was prepped and draped in the standard  fashion and Foley catheter inserted into the bladder.   A Pfannenstiel incision was made, fascia was divided sharply.  Underlying  rectus muscles were dissected off sharply.  Posterior sheath and peritoneum  elevated and entered sharply.  Bladder blade inserted.  Bladder flap created  sharply.  Uterine incision made in the low transverse fashion with a knife.  Clear fluid on amniotomy.  The incision extended laterally with bandage  scissors.  The feet were delivered through the incision and baby was  delivered to the level of the sacrum with fundal pressure and traction of  the feet, rotating the sacrum anterior, delivered to the level of the  scapulae.  Each arm was delivered by rotation of the torso and flexion at  the elbow.  Head was delivered by flexion of the neck.  Nose and mouth  suctioned with the bulb.  Cord was clamped and cut and infant handed off to  the awaiting pediatricians.  One gram of Ancef was given at cord clamp.   Placenta was removed manually.  Uterus exteriorized and cleared of all clots  and debris.  The uterine incision closed in a  running locked fashion with 0  chromic.  A second imbricating layer placed and hemostasis was obtained.  Uterus reinserted.  Pelvic irrigated.  Uterine incision was hemostatic as was the bladder flap  and subfascial space.  The fascia was closed with a running stitch of 0  Vicryl.  Subcutaneous tissue was irrigated and made hemostatic with the  Bovie and reapproximated  with a running layer of plain 2-0 suture.  Skin was  closed with staples.  The patient tolerated the procedure well.  There were  no complications.  The patient was taken to the recovery room awake, alert,  in stable condition.  All counts correct by the operating room staff.      Gerri Spore B. Earlene Plater, M.D.  Electronically Signed     WBD/MEDQ  D:  04/19/2005  T:  04/19/2005  Job:  [id number]

## 2010-09-07 NOTE — Assessment & Plan Note (Signed)
[facility]                                 ON-CALL NOTE   CALLAHAN, PEDDIE                      MRN:          [id number]  DATE:03/16/2006                            DOB:          [birth year]    TELEPHONE TRIAGE NOTE:   TIME RECEIVED:  5:58 p.m.   CALLER:  Macaiah Mangal   She sees Dr. Fabian Sharp.   Telephone: [phone].   The patient has had one week of dull, mild to moderate headaches which  are generalized across her forehead.  She has had some blurred vision at  times.  There has been some mild nausea but no vomiting.  The headaches  have not been severe.  There is no recent history of trauma, and there  are no other neurologic deficits associated with them.  When I asked her  if she had a history of migraine headaches, she said, indeed, she had  been diagnosed with them as a teenager.  My response is that these  probably represent migraine headaches.  She can rest and use ibuprofen  tonight as needed.  She will call me back if they get worse.  Otherwise,  I suggested she be seen in the office tomorrow for further evaluation.     Tera Mater. Clent Ridges, MD  Electronically Signed    SAF/MedQ  DD: 03/16/2006  DT: 03/16/2006  Job #: [id number]

## 2010-09-07 NOTE — Discharge Summary (Signed)
NAMESHELLEY, [name]             ACCOUNT NO.:  [id number]   MEDICAL RECORD NO.:  [id number]          PATIENT TYPE:  INP   LOCATION:  9103                          [facility]ACILITY:  [facility]   PHYSICIAN:  Gerri Spore B. Earlene Plater, M.D.  DATE OF BIRTH:  14-Oct-1975   DATE OF ADMISSION:  04/19/2005  DATE OF DISCHARGE:  04/21/2005                                 DISCHARGE SUMMARY   ADMISSION DIAGNOSES:  1.  Intrauterine pregnancy at 38+ weeks.  2.  Breech presentation.  3.  Nausea and vomiting.   DISCHARGE DIAGNOSES:  1.  Intrauterine pregnancy at 38+ weeks.  2.  Breech presentation.  3.  Nausea and vomiting.   OPERATION/PROCEDURE:  Primary low transverse cesarean section.   HISTORY OF PRESENT ILLNESS:  A 35 year old white female, gravida 1, para 0,  38 weeks 5 days with history of chronic hypertension with increasing nausea  and vomiting.  PIH labs were normal and 24-hour urine was normal. However,  symptoms were worsening.  Given she was essentially 39 weeks, I recommended  delivery.  She had previously attempted external cephalic version.  However,  it was unsuccessful.   HOSPITAL COURSE:  The patient was admitted and underwent primary low  transverse cesarean section.  Findings included a viable female, double-  footling breech, Apgars 9 and 9, weight 7 pounds 4 ounces.  Normal-appearing  uterus, tubes and ovaries noted.  Postoperatively rapidly regained her  ability to ambulate, void and tolerate her diet.  Blood pressures remained  stable in the 140's-150's/70's-80's range. She was discharged home on the  second postoperative day in satisfactory condition.   DISCHARGE INSTRUCTIONS:  Standard preprinted instructions given prior to  dismissal.   FOLLOW UP:  [facility] in two days for staple removal and one month for  routine postpartum visit.   CONDITION ON DISCHARGE:  Satisfactory.   DISCHARGE MEDICATIONS:  1.  Tylox one to two p.o. q.4-6h. p.r.n. pain.  2.  Motrin 600 mg every six  hours p.r.n. pain.     Gerri Spore B. Earlene Plater, M.D.  Electronically Signed    WBD/MEDQ  D:  04/21/2005  T:  04/22/2005  Job:  [id number]

## 2010-09-18 ENCOUNTER — Encounter (INDEPENDENT_AMBULATORY_CARE_PROVIDER_SITE_OTHER): Payer: Self-pay | Admitting: Surgery

## 2010-11-01 ENCOUNTER — Ambulatory Visit (INDEPENDENT_AMBULATORY_CARE_PROVIDER_SITE_OTHER): Payer: Commercial Managed Care - PPO | Admitting: Surgery

## 2010-11-01 ENCOUNTER — Encounter (INDEPENDENT_AMBULATORY_CARE_PROVIDER_SITE_OTHER): Payer: Self-pay | Admitting: Surgery

## 2010-11-01 VITALS — BP 142/98 | Ht 66.0 in | Wt 162.0 lb

## 2010-11-01 DIAGNOSIS — Z4651 Encounter for fitting and adjustment of gastric lap band: Secondary | ICD-10-CM

## 2010-11-01 NOTE — Patient Instructions (Signed)
You are doing very well.  We will see you back in November (lap band anniversary).

## 2010-11-01 NOTE — Progress Notes (Signed)
The patient is a 35 year old white female who is a patient of Dr. Berniece Andreas.  She had a lap band placed on 07 March 2009. Her weight initially was 250 pounds and a BMI of 40.7. Her weight today is 162 pounds with a BMI of 26.  I have not seen [name] since April 2011.  [name] has managed her band fills.  She has been tight before when she flies.  This time she just got tight over the last few weeks and she did not think that she could wait until [name] came back.  So she is here today because her band is too tight and she feels pressure in her esophagus.  She is exercising almost daily.  She does spin classes and yoga.  Physical Exam: Abdomen:  Port is in good position.  Incisions well healed.  No hernia.  Lap Band Adjustment: I removed 1 cc of fluid.  The band was under a little pressure when I accessed it.  Impression and plan: #1. Status post lap band placement. Has had very good weight loss.  And she has done well with exercise.  She will see [place] back at her Lap Band anniversary, November 2012.       If the band is too loose, she will contact us earlier for an adjustment.  #2. Hypercholesterolemia.  Off meds. #3. History of hypertension.  On no meds. #4. Pseudotumor cerebri.  Has resolved.  Her symptoms were headaches. #5. Her father is [name], a retired Company secretary.  He is at the beach at [facility], [place].

## 2011-01-17 LAB — COMPREHENSIVE METABOLIC PANEL
ALT: 13
AST: 17
AST: 21
Albumin: 2 — ABNORMAL LOW
Albumin: 2.4 — ABNORMAL LOW
Alkaline Phosphatase: 70
BUN: 3 — ABNORMAL LOW
CO2: 21
CO2: 25
Calcium: 8.6
Chloride: 100
Creatinine, Ser: 0.55
GFR calc Af Amer: >60
GFR calc non Af Amer: >60
Potassium: 4.1
Sodium: 132 — ABNORMAL LOW
Total Bilirubin: 0.6
Total Protein: 6.2

## 2011-01-17 LAB — URINALYSIS, ROUTINE W REFLEX MICROSCOPIC
Glucose, UA: NEGATIVE
Hgb urine dipstick: NEGATIVE
Specific Gravity, Urine: 1.02
pH: 7

## 2011-01-17 LAB — CBC
HCT: 35.1 — ABNORMAL LOW
MCHC: 34.3
MCV: 87.5
Platelets: 205
RBC: 4.01
RBC: 4.27
RDW: 14
WBC: 12.6 — ABNORMAL HIGH

## 2011-01-17 LAB — URIC ACID: Uric Acid, Serum: 3.5

## 2011-01-21 LAB — COMPREHENSIVE METABOLIC PANEL
Albumin: 3.8
Alkaline Phosphatase: 75
BUN: 8
CO2: 23
Chloride: 106
Creatinine, Ser: 0.67
GFR calc non Af Amer: >60
Glucose, Bld: 115 — ABNORMAL HIGH
Potassium: 3.7
Total Bilirubin: 0.6

## 2011-01-21 LAB — DIFFERENTIAL
Basophils Absolute: 0.1
Basophils Relative: 1
Lymphocytes Relative: 20
Monocytes Absolute: 0.5
Neutro Abs: 8.6 — ABNORMAL HIGH
Neutrophils Relative %: 74

## 2011-01-21 LAB — CBC
HCT: 41.5
Hemoglobin: 14
MCV: 86.5
Platelets: 265
RBC: 4.8
WBC: 11.6 — ABNORMAL HIGH

## 2011-01-21 LAB — URINALYSIS, ROUTINE W REFLEX MICROSCOPIC
Bilirubin Urine: NEGATIVE
Glucose, UA: NEGATIVE
Hgb urine dipstick: NEGATIVE
Specific Gravity, Urine: 1.01
Urobilinogen, UA: 0.2

## 2011-01-21 LAB — LIPASE, BLOOD: Lipase: 28

## 2011-01-21 LAB — AMYLASE: Amylase: 51

## 2011-01-25 LAB — DIFFERENTIAL
Eosinophils Relative: 1 % (ref 0–5)
Lymphocytes Relative: 28 % (ref 12–46)
Lymphs Abs: 2.5 10*3/uL (ref 0.7–4.0)
Monocytes Absolute: 0.4 10*3/uL (ref 0.1–1.0)
Monocytes Relative: 5 % (ref 3–12)

## 2011-01-25 LAB — URINALYSIS, ROUTINE W REFLEX MICROSCOPIC
Bilirubin Urine: NEGATIVE
Nitrite: NEGATIVE
Specific Gravity, Urine: 1.004 — ABNORMAL LOW (ref 1.005–1.030)
pH: 6 (ref 5.0–8.0)

## 2011-01-25 LAB — CBC
MCHC: 32.9 g/dL (ref 30.0–36.0)
MCV: 88.1 fL (ref 78.0–100.0)
Platelets: 230 10*3/uL (ref 150–400)
RDW: 13.7 % (ref 11.5–15.5)

## 2011-01-25 LAB — PREGNANCY, URINE: Preg Test, Ur: NEGATIVE

## 2011-01-25 LAB — COMPREHENSIVE METABOLIC PANEL
AST: 19 U/L (ref 0–37)
Albumin: 3.5 g/dL (ref 3.5–5.2)
Calcium: 9 mg/dL (ref 8.4–10.5)
Creatinine, Ser: 0.64 mg/dL (ref 0.4–1.2)
GFR calc Af Amer: >60 mL/min (ref >60)
GFR calc non Af Amer: >60 mL/min (ref >60)
Total Protein: 7.1 g/dL (ref 6.0–8.3)

## 2011-02-13 ENCOUNTER — Ambulatory Visit (INDEPENDENT_AMBULATORY_CARE_PROVIDER_SITE_OTHER): Payer: Commercial Managed Care - PPO | Admitting: Surgery

## 2011-02-13 ENCOUNTER — Encounter (INDEPENDENT_AMBULATORY_CARE_PROVIDER_SITE_OTHER): Payer: Self-pay | Admitting: Surgery

## 2011-02-13 VITALS — BP 126/84 | HR 72 | Resp 16 | Ht 66.0 in | Wt 164.0 lb

## 2011-02-13 DIAGNOSIS — Z9884 Bariatric surgery status: Secondary | ICD-10-CM | POA: Insufficient documentation

## 2011-02-13 NOTE — Progress Notes (Addendum)
ASSESSMENT AND PLAN: 1. Status post lap band placement.   AP Standard - 03/07/2009  Initial weight 250, BMI 40.66.  I removed 3 cc today.  (All fluid)  She'll see me back in 4 weeks.  If she keeps having symptoms, consider UGI.    [UGI - shows some delay in esophageal emptying, but no obstruction or slip.  Discussed with patient. She is better than 10 days ago.  She will take antacids (Mylanta) with meals. She will check with [place] in about 3 weeks to update [place].  If still having significant problems, consider upper endo.  DN 02/22/2011]  2. Hypercholesterolemia. Off meds.  3. History of hypertension. On no meds.  4. Pseudotumor cerebri. Has resolved. Her symptoms were headaches.  5. Her father is [name], a retired Company secretary. He is at the beach at [facility], [place]. 6.  Acute dysphagia.  Questionably secondary to food stuck in esophagus.  I took all fluid out today and see how she does.  I also discussed possible slippage with patient.  HISTORY OF PRESENT ILLNESS:  [name] is a 35 y.o. (DOB: 1975-07-21)  white female who is a patient of Lorretta Harp, MD and comes to me today for lap band too tight/vomiting.  She was a work in.  I saw the patient in July 2012 when I removed some fluid from her band. She did well until 2 days ago when she ate some chicken and had vomiting. She could not keep food down yesterday. She is able to keep liquids down today.  I discussed potential diagnoses. Most likely this represents an impaction. I think she is best served by having all the fluid removed from her lap band. I also talked about lap band slippage.  PHYSICAL EXAM: BP 126/84  Pulse 72  Resp 16  Ht 5\' 6"  (1.676 m)  Wt 164 lb (74.39 kg)  BMI 26.47 kg/m2  Abdomen:  Soft.  Port okay.  BS positive.  I access the band and removed 3 cc of fluid.

## 2011-02-22 ENCOUNTER — Encounter (INDEPENDENT_AMBULATORY_CARE_PROVIDER_SITE_OTHER): Payer: Commercial Managed Care - PPO | Admitting: Surgery

## 2011-02-22 ENCOUNTER — Telehealth (INDEPENDENT_AMBULATORY_CARE_PROVIDER_SITE_OTHER): Payer: Self-pay

## 2011-02-22 ENCOUNTER — Ambulatory Visit
Admission: RE | Admit: 2011-02-22 | Discharge: 2011-02-22 | Disposition: A | Discharge status: Home / Self Care | Payer: 59 | Source: Ambulatory Visit | Attending: Surgery | Admitting: Surgery

## 2011-02-22 ENCOUNTER — Telehealth (INDEPENDENT_AMBULATORY_CARE_PROVIDER_SITE_OTHER): Payer: Self-pay | Admitting: Surgery

## 2011-02-22 DIAGNOSIS — R1110 Vomiting, unspecified: Secondary | ICD-10-CM

## 2011-02-22 NOTE — Telephone Encounter (Signed)
Dr Ezzard Standing called patient after reviewing results.

## 2011-02-22 NOTE — Telephone Encounter (Signed)
Patient calls today one week after her band fluid was removed.  She is still having some issues but not daily.  She vomited some yesterday and has reflux.  She is not tolerating solid foods well right now.  I paged Dr Ezzard Standing who advised to get a kub with ugi to identify the band.  I notified the pt and set the xray up.

## 2011-03-28 ENCOUNTER — Ambulatory Visit (INDEPENDENT_AMBULATORY_CARE_PROVIDER_SITE_OTHER): Payer: Commercial Managed Care - PPO | Admitting: Surgery

## 2011-03-28 ENCOUNTER — Encounter (INDEPENDENT_AMBULATORY_CARE_PROVIDER_SITE_OTHER): Payer: Self-pay | Admitting: Surgery

## 2011-03-28 DIAGNOSIS — Z9884 Bariatric surgery status: Secondary | ICD-10-CM

## 2011-03-28 NOTE — Patient Instructions (Signed)
1.  Watch you diet over the holidays.  2.  We'll recheck you in January.  3.  Enjoy Christmas!

## 2011-03-28 NOTE — Progress Notes (Signed)
ASSESSMENT AND PLAN: 1. Status post lap band placement.   AP Standard - 03/07/2009  Initial weight 250, BMI 40.66.  She has all the fluid out of her band.  She is doing much better.  The heart burn has resolved, but she still thinks things are going through the band slowly.  I think it is best to give her 6 more weeks of rest from the band.  She knows to be careful around the holidays about eating.  She'll see me or Mardelle Matte back in 6 weeks.   2. Hypercholesterolemia. Off meds.  3. History of hypertension. On no meds.  4. Pseudotumor cerebri. Has resolved. Her symptoms were headaches.  5. Her father is [name], a retired Company secretary. He is at the beach at [facility], [place]. 6.  Acute dysphagia.  I took all fluid out on 02/13/2011.  HISTORY OF PRESENT ILLNESS:  [name] is a 35 y.o. (DOB: [birth year])  white female who is a patient of Lorretta Harp, MD and comes to me today for lap band too tight/vomiting.  She was a work in.  I removed all the fluid from her band 02/13/2011.  The UGI (02/22/2011) showed delayed esophageal emptying, but no obstruction.  I reviewed the findings on the UGI with her.  She says she has to avoid beef.  And sometimes, even chicken hangs up.  Her heartburn has resolved.  And she has gained 6 pounds.  She knows she still has to practice portion control.  PHYSICAL EXAM: BP 168/108  Pulse 72  Temp(Src) 97.8 F (36.6 C) (Temporal)  Resp 18  Ht 5\' 6"  (1.676 m)  Wt 170 lb (77.111 kg)  BMI 27.44 kg/m2  Lungs:  Clear. Heart:  RRR.  No murmur. Abdomen:  Soft.  Port okay.  BS positive.  Data: I reviewed her UGI.

## 2011-04-12 ENCOUNTER — Encounter: Payer: Self-pay | Admitting: Family Medicine

## 2011-04-12 ENCOUNTER — Ambulatory Visit (INDEPENDENT_AMBULATORY_CARE_PROVIDER_SITE_OTHER): Payer: Self-pay | Admitting: Family Medicine

## 2011-04-12 VITALS — BP 120/86 | HR 81 | Temp 98.6°F | Wt 170.0 lb

## 2011-04-12 DIAGNOSIS — J039 Acute tonsillitis, unspecified: Secondary | ICD-10-CM

## 2011-04-12 MED ORDER — CEPHALEXIN 500 MG PO CAPS: 500.0000 mg | ORAL_CAPSULE | Freq: Three times a day (TID) | ORAL | Status: AC

## 2011-04-12 NOTE — Progress Notes (Signed)
  Subjective:    Patient ID: [name], female    DOB: 08/24/1975, 35 y.o.   MRN: [id number]  HPI Here for 2 days of body aches, ST, left ear pain, and fever. No cough. She has 2 kids at home with strep throat right now, and another one with Fifth Disease.    Review of Systems  Constitutional: Positive for fever.  HENT: Positive for ear pain and sore throat. Negative for congestion and sinus pressure.   Eyes: Negative.   Respiratory: Negative.        Objective:   Physical Exam  Constitutional: She appears well-developed and well-nourished.  HENT:  Right Ear: External ear normal.  Left Ear: External ear normal.  Nose: Nose normal.  Mouth/Throat: No oropharyngeal exudate.       Both tonsils are swollen, and the left one is red   Eyes: Conjunctivae are normal.  Neck: No thyromegaly present.  Pulmonary/Chest: Effort normal and breath sounds normal.  Lymphadenopathy:    She has no cervical adenopathy.          Assessment & Plan:  Rest, fluids, Motrin prn

## 2011-05-10 ENCOUNTER — Encounter (INDEPENDENT_AMBULATORY_CARE_PROVIDER_SITE_OTHER): Payer: Commercial Managed Care - PPO

## 2011-05-24 ENCOUNTER — Ambulatory Visit (INDEPENDENT_AMBULATORY_CARE_PROVIDER_SITE_OTHER): Payer: Commercial Managed Care - PPO | Admitting: Physician Assistant

## 2011-05-24 ENCOUNTER — Encounter (INDEPENDENT_AMBULATORY_CARE_PROVIDER_SITE_OTHER): Payer: Self-pay

## 2011-05-24 VITALS — BP 140/88 | HR 70 | Temp 98.1°F | Resp 18 | Ht 66.0 in | Wt 180.6 lb

## 2011-05-24 DIAGNOSIS — Z4651 Encounter for fitting and adjustment of gastric lap band: Secondary | ICD-10-CM

## 2011-05-24 NOTE — Progress Notes (Signed)
  HISTORY: [name] is a 36 y.o.female who received an AP-Standard lap-band in November 2010 by Dr. Ezzard Standing. She comes in today having last been seen in early December and is complaining of significant hunger, increasing portion sizes and increasing weight. She had been suffering reflux symptoms for which she was given a lap-band holiday since late October. Those symptoms have since resolved.  VITAL SIGNS: Filed Vitals:   05/24/11 1344  BP: 140/88  Pulse: 70  Temp: 98.1 F (36.7 C)  Resp: 18    PHYSICAL EXAM: Physical exam reveals a very well-appearing 36 y.o.female in no apparent distress Neurologic: Awake, alert, oriented Psych: Bright affect, conversant Respiratory: Breathing even and unlabored. No stridor or wheezing Abdomen: Soft, nontender, nondistended to palpation. Incisions well-healed. No incisional hernias. Port easily palpated. Extremities: Atraumatic, good range of motion.  ASSESMENT: 36 y.o.  female  s/p AP-Standard lap-band.   PLAN: The patient's port was accessed with a 20G Huber needle without difficulty. Clear fluid was aspirated and 2.5 mL saline was added to the port to give a total predicted volume of 2.5 mL. The patient was able to swallow water without difficulty following the procedure and was instructed to take clear liquids for the next 24-48 hours and advance slowly as tolerated.

## 2011-05-24 NOTE — Patient Instructions (Signed)
Take clear liquids for the next 48 hours. Thin protein shakes are ok to start on Saturday evening. You may begin foods with the consistency of yogurt, cottage cheese, cream soups, etc. on Sunday. Call us if you have persistent vomiting or regurgitation, night cough or reflux symptoms. Return as scheduled or sooner if you notice no changes in hunger/portion sizes.   

## 2011-06-15 ENCOUNTER — Ambulatory Visit (INDEPENDENT_AMBULATORY_CARE_PROVIDER_SITE_OTHER): Payer: Commercial Managed Care - PPO | Admitting: Family Medicine

## 2011-06-15 DIAGNOSIS — R519 Headache, unspecified: Secondary | ICD-10-CM

## 2011-06-15 DIAGNOSIS — R51 Headache: Secondary | ICD-10-CM

## 2011-06-15 DIAGNOSIS — G501 Atypical facial pain: Secondary | ICD-10-CM

## 2011-06-15 DIAGNOSIS — G44209 Tension-type headache, unspecified, not intractable: Secondary | ICD-10-CM

## 2011-06-15 DIAGNOSIS — M279 Disease of jaws, unspecified: Secondary | ICD-10-CM

## 2011-06-15 DIAGNOSIS — R6884 Jaw pain: Secondary | ICD-10-CM

## 2011-06-15 DIAGNOSIS — H9209 Otalgia, unspecified ear: Secondary | ICD-10-CM

## 2011-06-15 LAB — POCT CBC
Granulocyte percent: 60.8 % (ref 37–80)
HCT, POC: 44.8 % (ref 37.7–47.9)
Hemoglobin: 14.7 g/dL (ref 12.2–16.2)
Lymph, poc: 2.8 (ref 0.6–3.4)
MCH, POC: 30.4 pg (ref 27–31.2)
MCHC: 32.8 g/dL (ref 31.8–35.4)
MCV: 92.6 fL (ref 80–97)
MID (cbc): 0.5 (ref 0–0.9)
MPV: 10 fL (ref 0–99.8)
POC Granulocyte: 5 (ref 2–6.9)
POC LYMPH PERCENT: 33.6 %L (ref 10–50)
POC MID %: 5.6 % (ref 0–12)
Platelet Count, POC: 257 10*3/uL (ref 142–424)
RBC: 4.84 M/uL (ref 4.04–5.48)
RDW, POC: 12.7 %
WBC: 8.3 10*3/uL (ref 4.6–10.2)

## 2011-06-15 LAB — POCT SEDIMENTATION RATE: POCT SED RATE: 6 mm/hr (ref 0–22)

## 2011-06-15 MED ORDER — AZITHROMYCIN 250 MG PO TABS: ORAL_TABLET | ORAL | Status: AC

## 2011-06-15 MED ORDER — KETOROLAC TROMETHAMINE 60 MG/2ML IM SOLN
60.0000 mg | Freq: Once | INTRAMUSCULAR | Status: AC
  Administered 2011-06-15: 60 mg via INTRAMUSCULAR

## 2011-06-15 NOTE — Progress Notes (Signed)
[facility]:  Office Visit  Chief Complaint:  Chief Complaint  Patient presents with  . Jaw Pain    this am.  . Facial Pain    HPI: [name] is a 36 y.o. female who complains of  Bilateral jaw pain with referred pain to upper face starting last night at 3 am which woke her up from sleep. She has associated facial and head pain bilaterally and HA but no vision changes, diplopia, photophobia, dizziness or normal sxs associated with HAs, NO CP, SOB, diaphoresis, numbness or tingling. NO AMS, confusion. She has a h/o Pseudotumor cerebri due to her obesity prior to gastric banding. No menigeal signs, no stroke sxs. + history of migraine HAs  Past Medical History  Diagnosis Date  . Hyperlipidemia   . Hypertension   . Pseudotumor cerebri    Past Surgical History  Procedure Date  . Cholecystectomy 02/2008  . Cesarean section 2009 & 2006  . Laparoscopic gastric banding 2010    Family History  Problem Relation Age of Onset  . Cancer Father     skin  . Heart disease Father    Allergies  Allergen Reactions  . Codeine Phosphate Nausea And Vomiting   Prior to Admission medications   Medication Sig Start Date End Date Taking? Authorizing Provider  CALCIUM PO Take by mouth. 1 chewable    Yes Historical Provider, MD  Multiple Vitamin (MULTIVITAMIN) capsule Take 1 capsule by mouth daily.     Yes Historical Provider, MD  PRISTIQ 50 MG 24 hr tablet daily. 03/16/11  Yes Historical Provider, MD     ROS: The patient denies fevers, chills, night sweats, unintentional weight loss, chest pain, palpitations, wheezing, dyspnea on exertion, nausea, vomiting, abdominal pain, dysuria, hematuria, melena, numbness, weakness, or tingling. +jaw pain  All other systems have been reviewed and were otherwise negative with the exception of those mentioned in the HPI and as above.    PHYSICAL EXAM: Filed Vitals:   06/15/11 0948  BP: 145/95  Pulse: 78  Temp: 97.7 F (36.5  C)  Resp: 18   Filed Vitals:   06/15/11 0948  Height: 5' 7.5" (1.715 m)  Weight: 175 lb (79.379 kg)   Body mass index is 27.00 kg/(m^2).  General: Alert, no acute distress HEENT:  Normocephalic, atraumatic, oropharynx patent. PERRLA, EOMI; TMJ-no evidence of clicks or dislocation when opening/closing mandible. Nontender at TMJ on palpation. Cardiovascular:  Regular rate and rhythm, no rubs murmurs or gallops.  No Carotid bruits, radial pulse intact. No pedal edema.  Respiratory: Clear to auscultation bilaterally.  No wheezes, rales, or rhonchi.  No cyanosis, no use of accessory musculature GI: No organomegaly, abdomen is soft and non-tender, positive bowel sounds.  No masses. Skin: No rashes. Neurologic: Facial musculature symmetric. CN 2-12 grossly intact. 5/5 strength UE and Velmer Woelfel, sensation intact Psychiatric: Patient is appropriate throughout our interaction. Lymphatic: No cervical lymphadenopathy Musculoskeletal: Gait intact. Genitourinary:   LABS: CBC normal    ASSESSMENT/PLAN: Encounter Diagnoses  Name Primary?  . Facial pain   . Jaw pain   . Headache     1. ? Etiology pain- atypical migraine HA sxs vs referred pain from jaw, has had some URI sxs. Patient was treated with Toradol for pain in office and also Z-pack. 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 She was advised to to go to ER if her sxs worsen or if associated with  CP/weakness/SOB. Pending labs: ESR  2. Monitor BP at home, if ? 140/90 then need to be f/u for meds with PCP or [facility]  Gevorg Brum PHUONG, DO 06/15/2011 11:57 AM

## 2011-07-02 ENCOUNTER — Encounter: Payer: Self-pay | Admitting: Internal Medicine

## 2011-07-02 ENCOUNTER — Ambulatory Visit (INDEPENDENT_AMBULATORY_CARE_PROVIDER_SITE_OTHER)
Admission: RE | Admit: 2011-07-02 | Discharge: 2011-07-02 | Disposition: A | Discharge status: Home / Self Care | Payer: Commercial Managed Care - PPO | Source: Ambulatory Visit | Attending: Internal Medicine | Admitting: Internal Medicine

## 2011-07-02 ENCOUNTER — Ambulatory Visit (INDEPENDENT_AMBULATORY_CARE_PROVIDER_SITE_OTHER): Payer: Self-pay | Admitting: Internal Medicine

## 2011-07-02 VITALS — BP 144/100 | HR 98 | Temp 98.9°F | Wt 171.0 lb

## 2011-07-02 DIAGNOSIS — R030 Elevated blood-pressure reading, without diagnosis of hypertension: Secondary | ICD-10-CM

## 2011-07-02 DIAGNOSIS — I10 Essential (primary) hypertension: Secondary | ICD-10-CM

## 2011-07-02 DIAGNOSIS — G932 Benign intracranial hypertension: Secondary | ICD-10-CM

## 2011-07-02 DIAGNOSIS — R519 Headache, unspecified: Secondary | ICD-10-CM

## 2011-07-02 DIAGNOSIS — R51 Headache: Secondary | ICD-10-CM

## 2011-07-02 DIAGNOSIS — Z9884 Bariatric surgery status: Secondary | ICD-10-CM

## 2011-07-02 MED ORDER — TRIAMTERENE-HCTZ 37.5-25 MG PO TABS: 0.5000 | ORAL_TABLET | Freq: Every day | ORAL | Status: DC

## 2011-07-02 NOTE — Progress Notes (Signed)
  Subjective:    Patient ID: [name], female    DOB: [birth year], 36 y.o.   MRN: [id number]  HPI  Patient comes in today fo  new problem evaluation. Had an episode a few weeks ago of awakening in the middle of the night with face pain and headache. Says it was severe she ended up going to the [place] urgent care Dr. Nedra Hai for this they gave her Toradol and an antibiotic azithromycin in case it was a sinus infection and told her they weren't sure why she had the cause. She had no major change in her vision it was not felt to be her pseudotumor. Blood pressure was somewhat elevated. She did okay until this weekend and had another episode. She comes in for evaluation. Her blood pressure has been running high even know she has kept her weight off. Is not taking regular Sudafed significant alcohol.  She feels like she is clenching her jaw sometime but this is after the pain begins. She has gone back to work but has kept the weight off.   Review of Systems Negative for chest pain shortness of breath syncope change in her vision hearing changes bleeding bruising as per history of present illness   Past history family history social history reviewed in the electronic medical record. She has a history of pregnancy-induced hypertension.  Has an IUD one alcoholic beverage a couple times a week at most. BP in GM  father   She has been on Pristiq  since the fall per her OB/GYN and this has helped her.    Objective:   Physical Exam  Weight: 171 lb (77.565 kg)  Wt Readings from Last 3 Encounters:  07/02/11 171 lb (77.565 kg)  06/15/11 175 lb (79.379 kg)  05/24/11 180 lb 9.6 oz (81.92 kg)    Bp 140/90 92 right sitting  HEENT: Normocephalic ;atraumatic , Eyes;  PERRL, EOMs  Full, lids and conjunctiva clear,funci still blurry disc no H or E ,Ears: no deformities, canals nl, TM landmarks normal, Nose: no deformity or discharge  Mouth : OP clear without lesion or edema . Neck: Supple without  adenopathy or masses or bruits Chest:  Clear to A&P without wheezes rales or rhonchi CV:  S1-S2 no gallops or murmurs peripheral perfusion is normal NEURO: oriented x 3 CN 3-12 appear intact. No focal muscle weakness or atrophy. DTRs symmetrical. Gait WNL.  Grossly non focal. No tremor or abnormal movement.  Reviewed  Urgent care note     Assessment & Plan:  Elevated BP readings   ht  Early   Get more readings consider adding medicine prescription given. 1 do followup Nocturnal headaches x2 episodic still sound could be migrainous but questionable trigger new onset. Nonfocal exam today but will get head CT as a screening.  Followup with Dr. love as appropriate she has not been to see him for a while because she has done well.   psuedo tumor cerebri hx  different picture  Of sx  Hx off meds for now    Verbal report on head CT shows normal and no acute abnormalities. To report this to patient

## 2011-07-02 NOTE — Patient Instructions (Signed)
I am unsure why you're having face pain headache. This could be a version of a migraine.  However because she was never had this before we will do a plain head CT scan.  In the meantime monitor your blood pressure at time and if it is indeed up on 140 and above begin low-dose diuretic. For now I would avoid pseudoephedrine.   We plan on getting Dr. love to see you in followup for his opinion as to the cause of the recent symptoms you've been having.

## 2011-07-03 NOTE — Progress Notes (Signed)
Quick Note:  Pt aware on 07/02/11. ______

## 2011-09-05 ENCOUNTER — Encounter (INDEPENDENT_AMBULATORY_CARE_PROVIDER_SITE_OTHER): Payer: Commercial Managed Care - PPO

## 2011-09-12 ENCOUNTER — Ambulatory Visit
Admission: RE | Admit: 2011-09-12 | Discharge: 2011-09-12 | Disposition: A | Discharge status: Home / Self Care | Payer: Commercial Managed Care - PPO | Source: Ambulatory Visit | Attending: Physician Assistant | Admitting: Physician Assistant

## 2011-09-12 ENCOUNTER — Ambulatory Visit (INDEPENDENT_AMBULATORY_CARE_PROVIDER_SITE_OTHER): Payer: Commercial Managed Care - PPO | Admitting: Physician Assistant

## 2011-09-12 ENCOUNTER — Encounter (INDEPENDENT_AMBULATORY_CARE_PROVIDER_SITE_OTHER): Payer: Self-pay

## 2011-09-12 VITALS — BP 132/98 | HR 68 | Temp 97.4°F | Resp 12 | Ht 66.0 in | Wt 172.5 lb

## 2011-09-12 DIAGNOSIS — Z4651 Encounter for fitting and adjustment of gastric lap band: Secondary | ICD-10-CM

## 2011-09-12 NOTE — Patient Instructions (Signed)
Attend x-ray today. Take prilosec 20mg  tablets - one tablet twice a day for one week followed by one tablet daily. Return to see Dr. Ezzard Standing.

## 2011-09-12 NOTE — Progress Notes (Signed)
  HISTORY: [name] is a 36 y.o.female who received an AP-Standard lap-band in November 2010 by Dr. Ezzard Standing. She comes in with a one month history of significant GERD symptoms with regurgitation about 30 minutes following a meal. She has persistent daytime reflux as well necessitating Tums and Gaviscon on a regular basis. She had similar symptoms in October 2012 resulting in a band holiday as well as an upper GI which showed no evidence of slip or pouch dilatation. She has only 2.38mL total volume in the band.  VITAL SIGNS: Filed Vitals:   09/12/11 0835  BP: 132/98  Pulse: 68  Temp: 97.4 F (36.3 C)  Resp: 12    PHYSICAL EXAM: Physical exam reveals a very well-appearing 36 y.o.female in no apparent distress Neurologic: Awake, alert, oriented Psych: Bright affect, conversant Respiratory: Breathing even and unlabored. No stridor or wheezing Abdomen: Soft, nontender, nondistended to palpation. Incisions well-healed. No incisional hernias. Port easily palpated. Extremities: Atraumatic, good range of motion.  ASSESMENT: 36 y.o.  female  s/p AP-Standard lap-band.   PLAN: The patient's port was accessed with a 20G Huber needle without difficulty. Clear fluid was aspirated and 2.5 mL saline was removed from the port to give a total predicted volume of 0 mL. I've asked her to begin prilosec 20mg  BID for one week followed by QD. I've ordered a KUB to assess band position. I've asked that she see Dr. Ezzard Standing in the next couple of weeks to re-evaluate. I've also spoken with Dr. Ezzard Standing about her current situation and the proposed plan.

## 2011-09-26 ENCOUNTER — Ambulatory Visit (INDEPENDENT_AMBULATORY_CARE_PROVIDER_SITE_OTHER): Payer: Commercial Managed Care - PPO | Admitting: Surgery

## 2011-09-26 ENCOUNTER — Encounter (INDEPENDENT_AMBULATORY_CARE_PROVIDER_SITE_OTHER): Payer: Self-pay | Admitting: Surgery

## 2011-09-26 VITALS — BP 142/94 | HR 72 | Temp 97.9°F | Resp 14 | Ht 66.0 in | Wt 176.0 lb

## 2011-09-26 DIAGNOSIS — Z9884 Bariatric surgery status: Secondary | ICD-10-CM

## 2011-09-26 NOTE — Progress Notes (Signed)
Name:  [name] MRN:  [id number]  URGENT OFFICE  ASSESSMENT AND PLAN: 1. Status post lap band placement.   AP Standard - 03/07/2009  Initial weight 250, BMI 40.66.  2.  Recurrent nausea and vomiting.  I gave her a band holiday by pulling out all the fluid 02/13/2011.  Mardelle Matte started refilling the band 05/24/2011.  She did well until a couple weeks ago, then had regurgitation symptoms again and Mardelle Matte took all the fluid out of her band 09/12/2011.  She was doing well until this AM, when she got sick with all the fluid out of the band.  Plan: to give her at least one week to see is she gets better.  If her symptoms persist beyond one week, would consider a repeat UGI.  If her symptoms resolve, will see Mardelle Matte or me back in 8 weeks and consider refilling her lap band.  2. Hypercholesterolemia. Off meds.  3. History of hypertension. On no meds.  4. Pseudotumor cerebri. Has resolved. Her symptoms were headaches.   She did have some headaches in Dec. 2012 and this prompted another CT scan of the head, but no abnormality was found. 5. Her father is [name], a retired Company secretary. He is at the beach at [facility], [place].  HISTORY OF PRESENT ILLNESS:  [name] is a 36 y.o. (DOB: Apr 26, 1975)  white female who is a patient of Lorretta Harp, MD, MD and comes to me today for vomiting and trouble keeping solids down.  She had one "band holiday" from Oct 2012 to Feb 2013. She had a refill by Mardelle Matte in Feb 2013 and was doing well until about 2 weeks ago.  Then about 2 weeks ago, she saw Mardelle Matte for about one month history of symptoms of reflux and regurgitation.  Mardelle Matte removed all the fluid and got a KUB.  The KUB (09/12/2011) shows that the lap band is still in good position.    She did well post fluid removal until today, then she started having upper GI symptoms again. She got sick and started vomiting today.  She was sent to the Urgent Office.  She otherwise is doing well and is happy with  her lap band.  PHYSICAL EXAM: BP 142/94  Pulse 72  Temp(Src) 97.9 F (36.6 C) (Temporal)  Resp 14  Ht 5\' 6"  (1.676 m)  Wt 176 lb (79.833 kg)  BMI 28.41 kg/m2  Lungs:  Clear. Heart:  RRR.  No murmur. Abdomen:  Soft.  Port okay.  BS positive.  She has no tenderness.  Data: I reviewed Andy's note and KUB.  Ovidio Kin, MD, [facility] Pager: [phone] Office phone:  [phone]

## 2011-10-17 ENCOUNTER — Encounter (INDEPENDENT_AMBULATORY_CARE_PROVIDER_SITE_OTHER): Payer: Commercial Managed Care - PPO

## 2011-11-21 ENCOUNTER — Encounter (INDEPENDENT_AMBULATORY_CARE_PROVIDER_SITE_OTHER): Payer: Commercial Managed Care - PPO

## 2011-12-12 ENCOUNTER — Ambulatory Visit (INDEPENDENT_AMBULATORY_CARE_PROVIDER_SITE_OTHER): Payer: Commercial Managed Care - PPO | Admitting: Physician Assistant

## 2011-12-12 ENCOUNTER — Encounter (INDEPENDENT_AMBULATORY_CARE_PROVIDER_SITE_OTHER): Payer: Self-pay | Admitting: Physician Assistant

## 2011-12-12 VITALS — BP 120/84 | HR 68 | Resp 14 | Ht 66.0 in | Wt 186.6 lb

## 2011-12-12 DIAGNOSIS — Z4651 Encounter for fitting and adjustment of gastric lap band: Secondary | ICD-10-CM

## 2011-12-12 NOTE — Patient Instructions (Signed)
Take clear liquids tonight. Thin protein shakes are ok to start tomorrow morning. Slowly advance your diet thereafter. Call us if you have persistent vomiting or regurgitation, night cough or reflux symptoms. Return as scheduled or sooner if you notice no changes in hunger/portion sizes.  

## 2011-12-12 NOTE — Progress Notes (Signed)
  HISTORY: [name] is a 36 y.o.female who received an AP-Standard lap-band in November 2010 by Dr. Ezzard Standing. She had all fluid removed earlier in the summer for obstructive symptoms but now is having excessive hunger and larger than desired portion sizes, as expected. No further untoward symptoms. She'd like a fill today.  VITAL SIGNS: Filed Vitals:   12/12/11 0853  BP: 120/84  Pulse: 68  Resp: 14    PHYSICAL EXAM: Physical exam reveals a very well-appearing 36 y.o.female in no apparent distress Neurologic: Awake, alert, oriented Psych: Bright affect, conversant Respiratory: Breathing even and unlabored. No stridor or wheezing Abdomen: Soft, nontender, nondistended to palpation. Incisions well-healed. No incisional hernias. Port easily palpated. Extremities: Atraumatic, good range of motion.  ASSESMENT: 36 y.o.  female  s/p AP-Standard lap-band.   PLAN: The patient's port was accessed with a 20G Huber needle without difficulty. Clear fluid was aspirated and 2 mL saline was added to the port to give a total predicted volume of 2 mL. The patient was able to swallow water without difficulty following the procedure and was instructed to take clear liquids for the next 24-48 hours and advance slowly as tolerated.

## 2012-01-16 ENCOUNTER — Ambulatory Visit (INDEPENDENT_AMBULATORY_CARE_PROVIDER_SITE_OTHER): Payer: Commercial Managed Care - PPO | Admitting: Family Medicine

## 2012-01-16 ENCOUNTER — Encounter: Payer: Self-pay | Admitting: Family Medicine

## 2012-01-16 VITALS — BP 120/90 | Wt 182.0 lb

## 2012-01-16 DIAGNOSIS — J040 Acute laryngitis: Secondary | ICD-10-CM

## 2012-01-16 NOTE — Patient Instructions (Addendum)
      Laryngitis At the top of your windpipe is your voice box. It is the source of your voice. Inside your voice box are 2 bands of muscles called vocal cords. When you breathe, your vocal cords are relaxed and open so that air can get into the lungs. When you decide to say something, these cords come together and vibrate. The sound from these vibrations goes into your throat and comes out through your mouth as sound. Laryngitis is an inflammation of the vocal cords that causes hoarseness, cough, loss of voice, sore throat, and dry throat. Laryngitis can be temporary (acute) or long-term (chronic). Most cases of acute laryngitis improve with time.Chronic laryngitis lasts for more than 3 weeks. CAUSES Laryngitis can often be related to excessive smoking, talking, or yelling, as well as inhalation of toxic fumes and allergies. Acute laryngitis is usually caused by a viral infection, vocal strain, measles or mumps, or bacterial infections. Chronic laryngitis is usually caused by vocal cord strain, vocal cord injury, postnasal drip, growths on the vocal cords, or acid reflux. SYMPTOMS   Cough.   Sore throat.   Dry throat.  RISK FACTORS  Respiratory infections.   Exposure to irritating substances, such as cigarette smoke, excessive amounts of alcohol, stomach acids, and workplace chemicals.   Voice trauma, such as vocal cord injury from shouting or speaking too loud.  DIAGNOSIS  Your cargiver will perform a physical exam. During the physical exam, your caregiver will examine your throat. The most common sign of laryngitis is hoarseness. Laryngoscopy may be necessary to confirm the diagnosis of this condition. This procedure allows your caregiver to look into the larynx. HOME CARE INSTRUCTIONS  Drink enough fluids to keep your urine clear or pale yellow.   Rest until you no longer have symptoms or as directed by your caregiver.   Breathe in moist air.   Take all medicine as directed by  your caregiver.   Do not smoke.   Talk as little as possible (this includes whispering).   Write on paper instead of talking until your voice is back to normal.   Follow up with your caregiver if your condition has not improved after 10 days.  SEEK MEDICAL CARE IF:   You have trouble breathing.   You cough up blood.   You have persistent fever.   You have increasing pain.   You have difficulty swallowing.  MAKE SURE YOU:  Understand these instructions.   Will watch your condition.   Will get help right away if you are not doing well or get worse.  Document Released: 04/08/2005 Document Revised: 03/28/2011 Document Reviewed: 06/14/2010 [facility] Patient Information 2012 [place], [place].

## 2012-01-16 NOTE — Progress Notes (Signed)
Chief Complaint  Patient presents with  . Hoarse    sore throat, drainage, fatigue     HPI:  hoarse voice: -started 3 days ago -symptoms: sore throat, drainage, mild nasal congestion, hoarseness since yesterday, cough, achy -denies: fevers, chills, SOB -no sick contacts  ROS: See pertinent positives and negatives per HPI.  Past Medical History  Diagnosis Date  . Hyperlipidemia     Better with weight loss  . Hypertension     Better with weight loss  . Pseudotumor cerebri     Treated with medication and weight loss  . History of bariatric surgery     LAP-BAND  . History of migraine     Family History  Problem Relation Age of Onset  . Cancer Father     skin  . Heart disease Father     History   Social History  . Marital Status: Married    Spouse Name: N/A    Number of Children: N/A  . Years of Education: N/A   Social History Main Topics  . Smoking status: Former Smoker    Quit date: 11/01/2003  . Smokeless tobacco: Never Used  . Alcohol Use: Yes     1-3 drinks weekly  . Drug Use: No  . Sexually Active: None   Other Topics Concern  . 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 1 chewable , Disp: , Rfl: ;  levonorgestrel (MIRENA) 20 MCG/24HR IUD, 1 each by Intrauterine route once., Disp: , Rfl: ;  Multiple Vitamin (MULTIVITAMIN) capsule, Take 1 capsule by mouth daily.  , Disp: , Rfl: ;  omeprazole (PRILOSEC) 20 MG capsule, Take 20 mg by mouth daily., Disp: , Rfl: ;  PRISTIQ 50 MG 24 hr tablet, daily., Disp: , Rfl:  triamterene-hydrochlorothiazide (MAXZIDE-25) 37.5-25 MG per tablet, Take 0.5 each (0.5 tablets total) by mouth daily., Disp: 30 tablet, Rfl: 2  EXAM:  Filed Vitals:   01/16/12 1355  BP: 120/90    There is no height on file to calculate BMI.  GENERAL: vitals reviewed and listed below,  alert, oriented, appears well hydrated and in no acute distress  HEENT: atraumatic, conjucntiva clear, no obvious abnormalities on inspection of external nose and ears, normal inspection of ear canals and TMS - some mild scarring of TMs, clear rhinorrhea, cryptic tonsils, PND, hoarse voice  NECK: no masses on inspection, no LAD  LUNGS: clear to auscultation bilaterally, no wheezes, rales or rhonchi, good air movement  CV: HRRR, no peripheral edema  MS: moves all extremities without noticeable abnormality  PSYCH: pleasant and cooperative, no obvious depression or anxiety  ASSESSMENT AND PLAN:  Discussed the following assessment and plan:  1. Laryngitis     No orders of the defined types were placed in this encounter.    Patient Instructions       Laryngitis At the top of your windpipe is your voice box. It is the source of your voice. Inside your voice box are 2 bands of muscles called vocal cords. When you breathe, your vocal cords are relaxed and open so that air can get into the lungs. When you decide to say something, these cords come together and vibrate. The sound from these vibrations goes into your throat and comes out through your mouth as sound. Laryngitis is an inflammation of the vocal cords that causes hoarseness, cough, loss of voice,  sore throat, and dry throat. Laryngitis can be temporary (acute) or long-term (chronic). Most cases of acute laryngitis improve with time.Chronic laryngitis lasts for more than 3 weeks. CAUSES Laryngitis can often be related to excessive smoking, talking, or yelling, as well as inhalation of toxic fumes and allergies. Acute laryngitis is usually caused by a viral infection, vocal strain, measles or mumps, or bacterial infections. Chronic laryngitis is usually caused by vocal cord strain, vocal cord injury, postnasal drip, growths on the vocal cords, or acid reflux. SYMPTOMS   Cough.   Sore throat.   Dry throat.  RISK  FACTORS  Respiratory infections.   Exposure to irritating substances, such as cigarette smoke, excessive amounts of alcohol, stomach acids, and workplace chemicals.   Voice trauma, such as vocal cord injury from shouting or speaking too loud.  DIAGNOSIS  Your cargiver will perform a physical exam. During the physical exam, your caregiver will examine your throat. The most common sign of laryngitis is hoarseness. Laryngoscopy may be necessary to confirm the diagnosis of this condition. This procedure allows your caregiver to look into the larynx. HOME CARE INSTRUCTIONS  Drink enough fluids to keep your urine clear or pale yellow.   Rest until you no longer have symptoms or as directed by your caregiver.   Breathe in moist air.   Take all medicine as directed by your caregiver.   Do not smoke.   Talk as little as possible (this includes whispering).   Write on paper instead of talking until your voice is back to normal.   Follow up with your caregiver if your condition has not improved after 10 days.  SEEK MEDICAL CARE IF:   You have trouble breathing.   You cough up blood.   You have persistent fever.   You have increasing pain.   You have difficulty swallowing.  MAKE SURE YOU:  Understand these instructions.   Will watch your condition.   Will get help right away if you are not doing well or get worse.  Document Released: 04/08/2005 Document Revised: 03/28/2011 Document Reviewed: 06/14/2010 [facility] Patient Information 2012 [place], [place].    Return to clinic immediately if symptoms worsen or persist or new concerns.  Return if symptoms worsen or fail to improve.  Kriste Basque R.

## 2012-01-23 ENCOUNTER — Encounter (INDEPENDENT_AMBULATORY_CARE_PROVIDER_SITE_OTHER): Payer: Self-pay

## 2012-01-23 ENCOUNTER — Ambulatory Visit (INDEPENDENT_AMBULATORY_CARE_PROVIDER_SITE_OTHER): Payer: Commercial Managed Care - PPO | Admitting: Physician Assistant

## 2012-01-23 VITALS — BP 128/82 | HR 83 | Temp 98.7°F | Ht 66.0 in | Wt 178.4 lb

## 2012-01-23 DIAGNOSIS — Z4651 Encounter for fitting and adjustment of gastric lap band: Secondary | ICD-10-CM

## 2012-01-23 NOTE — Progress Notes (Signed)
  HISTORY: [name] is a 36 y.o.female who received an AP-Standard lap-band in November 2010 by Dr. Ezzard Standing. She is [place] the [place] by air next week and is asking for her fluid to be removed. She has no untoward complaints.  VITAL SIGNS: Filed Vitals:   01/23/12 0944  BP: 128/82  Pulse: 83  Temp: 98.7 F (37.1 C)    PHYSICAL EXAM: Physical exam reveals a very well-appearing 36 y.o.female in no apparent distress Neurologic: Awake, alert, oriented Psych: Bright affect, conversant Respiratory: Breathing even and unlabored. No stridor or wheezing Abdomen: Soft, nontender, nondistended to palpation. Incisions well-healed. No incisional hernias. Port easily palpated. Extremities: Atraumatic, good range of motion.  ASSESMENT: 36 y.o.  female  s/p AP-Standard lap-band.   PLAN: The patient's port was accessed with a 20G Huber needle without difficulty. Clear fluid was aspirated and 2 mL saline was removed from the port to give a total predicted volume of 0 mL.

## 2012-01-23 NOTE — Patient Instructions (Signed)
Return in three weeks. Focus on good food choices as well as physical activity. Return sooner if you have an increase in hunger, portion sizes or weight. Return also for difficulty swallowing, night cough, reflux.   

## 2012-02-13 ENCOUNTER — Encounter (INDEPENDENT_AMBULATORY_CARE_PROVIDER_SITE_OTHER): Payer: Commercial Managed Care - PPO

## 2012-03-05 ENCOUNTER — Encounter (INDEPENDENT_AMBULATORY_CARE_PROVIDER_SITE_OTHER): Payer: Commercial Managed Care - PPO

## 2012-03-26 ENCOUNTER — Encounter (INDEPENDENT_AMBULATORY_CARE_PROVIDER_SITE_OTHER): Payer: Self-pay

## 2012-03-26 ENCOUNTER — Ambulatory Visit (INDEPENDENT_AMBULATORY_CARE_PROVIDER_SITE_OTHER): Payer: Commercial Managed Care - PPO | Admitting: Physician Assistant

## 2012-03-26 DIAGNOSIS — Z9884 Bariatric surgery status: Secondary | ICD-10-CM

## 2012-03-26 DIAGNOSIS — K219 Gastro-esophageal reflux disease without esophagitis: Secondary | ICD-10-CM

## 2012-03-26 NOTE — Patient Instructions (Signed)
Obtain Upper GI. We will follow-up with the results.

## 2012-03-26 NOTE — Progress Notes (Signed)
  HISTORY: [name] is a 36 y.o.female who received an AP-Standard lap-band in November 2010 by Dr. Ezzard Standing. She comes in after returning from a trip for which we removed all the fluid from her band. She's gained about 4 lbs but this has been over the course of the holidays. Interestingly, she continues to have small portion sizes and no persistent hunger. She does complain of occasional nocturnal regurgitation despite taking a daily PPI. Several Tums do not alleviate the symptoms.  VITAL SIGNS: Filed Vitals:   03/26/12 1052  BP: 136/98  Pulse: 88  Temp: 98.4 F (36.9 C)    PHYSICAL EXAM: Physical exam reveals a very well-appearing 36 y.o.female in no apparent distress Neurologic: Awake, alert, oriented Psych: Bright affect, conversant Respiratory: Breathing even and unlabored. No stridor or wheezing Extremities: Atraumatic, good range of motion. Skin: Warm, Dry, no rashes Musculoskeletal: Normal gait, Joints normal  ASSESMENT: 36 y.o.  female  s/p AP-Standard lap-band.   PLAN: She had an upper GI 13 months ago which revealed no obstruction but slow transit of a barium pill. It also showed sluggish progress of contrast in the lower esophagus and some dysmotility. I'm not sure if this is the root cause of her current symptoms but I do want to rule out any changes in the intervening year. As such I've ordered a repeat study and will have her return to discuss the results. We've decided to not do a fill today.

## 2012-04-06 ENCOUNTER — Ambulatory Visit
Admission: RE | Admit: 2012-04-06 | Discharge: 2012-04-06 | Disposition: A | Discharge status: Home / Self Care | Payer: Commercial Managed Care - PPO | Source: Ambulatory Visit | Attending: Physician Assistant | Admitting: Physician Assistant

## 2012-04-06 DIAGNOSIS — K219 Gastro-esophageal reflux disease without esophagitis: Secondary | ICD-10-CM

## 2012-04-06 DIAGNOSIS — Z9884 Bariatric surgery status: Secondary | ICD-10-CM

## 2012-04-09 ENCOUNTER — Encounter (INDEPENDENT_AMBULATORY_CARE_PROVIDER_SITE_OTHER): Payer: Commercial Managed Care - PPO

## 2012-04-09 ENCOUNTER — Encounter (INDEPENDENT_AMBULATORY_CARE_PROVIDER_SITE_OTHER): Payer: Self-pay | Admitting: Physician Assistant

## 2012-04-09 ENCOUNTER — Ambulatory Visit (INDEPENDENT_AMBULATORY_CARE_PROVIDER_SITE_OTHER): Payer: Commercial Managed Care - PPO | Admitting: Physician Assistant

## 2012-04-09 VITALS — BP 142/88 | HR 72 | Temp 97.8°F | Resp 20 | Ht 66.0 in | Wt 183.4 lb

## 2012-04-09 DIAGNOSIS — Z4651 Encounter for fitting and adjustment of gastric lap band: Secondary | ICD-10-CM

## 2012-04-09 NOTE — Progress Notes (Signed)
  HISTORY: [name] is a 36 y.o.female who received an AP-Standard lap-band in November 2010 by Dr. Ezzard Standing. She comes in after a lap band holiday and an upper GI. It showed no evidence of obstruction. She says her GERD has much improved. She's taking OTC prilosec daily and does fine so long as she continues the medication. She is hungry however and would like a fill.  VITAL SIGNS: Filed Vitals:   04/09/12 1459  BP: 142/88  Pulse: 72  Temp: 97.8 F (36.6 C)  Resp: 20    PHYSICAL EXAM: Physical exam reveals a very well-appearing 36 y.o.female in no apparent distress Neurologic: Awake, alert, oriented Psych: Bright affect, conversant Respiratory: Breathing even and unlabored. No stridor or wheezing Abdomen: Soft, nontender, nondistended to palpation. Incisions well-healed. No incisional hernias. Port easily palpated. Extremities: Atraumatic, good range of motion.  ASSESMENT: 36 y.o.  female  s/p AP-Standard lap-band.   PLAN: The patient's port was accessed with a 20G Huber needle without difficulty. Clear fluid was aspirated and 2 mL saline was added to the port to give a total predicted volume of 2 mL. The patient was able to swallow water without difficulty following the procedure and was instructed to take clear liquids for the next 24-48 hours and advance slowly as tolerated.

## 2012-04-09 NOTE — Patient Instructions (Signed)
Take clear liquids tonight. Thin protein shakes are ok to start tomorrow morning. Slowly advance your diet thereafter. Call us if you have persistent vomiting or regurgitation, night cough or reflux symptoms. Return as scheduled or sooner if you notice no changes in hunger/portion sizes.  

## 2012-06-12 ENCOUNTER — Ambulatory Visit (INDEPENDENT_AMBULATORY_CARE_PROVIDER_SITE_OTHER): Payer: Commercial Managed Care - PPO | Admitting: Internal Medicine

## 2012-06-12 ENCOUNTER — Encounter: Payer: Self-pay | Admitting: Internal Medicine

## 2012-06-12 VITALS — BP 144/100 | HR 78 | Temp 98.3°F | Wt 193.0 lb

## 2012-06-12 DIAGNOSIS — R599 Enlarged lymph nodes, unspecified: Secondary | ICD-10-CM

## 2012-06-12 DIAGNOSIS — J029 Acute pharyngitis, unspecified: Secondary | ICD-10-CM

## 2012-06-12 DIAGNOSIS — I10 Essential (primary) hypertension: Secondary | ICD-10-CM

## 2012-06-12 DIAGNOSIS — R590 Localized enlarged lymph nodes: Secondary | ICD-10-CM

## 2012-06-12 DIAGNOSIS — J988 Other specified respiratory disorders: Secondary | ICD-10-CM

## 2012-06-12 DIAGNOSIS — J22 Unspecified acute lower respiratory infection: Secondary | ICD-10-CM | POA: Insufficient documentation

## 2012-06-12 MED ORDER — AMOXICILLIN 500 MG PO CAPS: 500.0000 mg | ORAL_CAPSULE | Freq: Two times a day (BID) | ORAL | Status: DC

## 2012-06-12 NOTE — Patient Instructions (Addendum)
This is probably a viral respiratory infection bu context and exa. However  Because of the unilateral tonsil pain and gland tenderness you could be getting a bacterial tonsillitis.    If left throat pain and swelling  In continuing and getting worse can add antibiotic for this part .   Otherwise voice rests and  Comfort measures. Cough and congestion can last weeks .  Contact us with alarm features if needed

## 2012-06-12 NOTE — Progress Notes (Signed)
Chief Complaint  Patient presents with  . Sore Throat    Started on Monday with bodyaches.  Cough is not productive but is keeping her up at night.  Has some post nasal drip.  Left ear is somewhat painful.  Treating with OTC Sudafed.  . Nasal Congestion  . Hoarse  . Cough  . Otalgia    HPI: Patient comes in today for SDA for  new problem evaluation. Here with daughter  Onset 4 days ago as above congestion and  Hoarseness and cough  now left  Tonsil and ear pain    Using otc sudafed.  Has left ear pain and swollen gland left throat tonsil enlarged ,  No cp sob wheezing .   No face pain cough dry mostely nocturnal  ROS: See pertinent positives and negatives per HPI.  bp has been ok recently   No meds eye changes   Past Medical History  Diagnosis Date  . Hyperlipidemia     Better with weight loss  . Hypertension     Better with weight loss  . Pseudotumor cerebri     Treated with medication and weight loss  . History of bariatric surgery     LAP-BAND  . History of migraine     Family History  Problem Relation Age of Onset  . Cancer Father     skin  . Heart disease Father     History   Social History  . Marital Status: Married    Spouse Name: N/A    Number of Children: N/A  . Years of Education: N/A   Social History Main Topics  . Smoking status: Former Smoker    Quit date: 11/01/2003  . Smokeless tobacco: Never Used  . Alcohol Use: Yes     Comment: 1-3 drinks weekly  . Drug Use: No  . Sexually Active: None   Other Topics Concern  . 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 CALCIUM PO Take by mouth. 1 chewable       . levonorgestrel (MIRENA) 20 MCG/24HR IUD 1 each by Intrauterine route once.      . Multiple Vitamin (MULTIVITAMIN) capsule Take 1 capsule by mouth  daily.        Marland Kitchen omeprazole (PRILOSEC) 20 MG capsule Take 20 mg by mouth daily.      Marland Kitchen PRISTIQ 50 MG 24 hr tablet daily.      Marland Kitchen amoxicillin (AMOXIL) 500 MG capsule Take 1 capsule (500 mg total) by mouth 2 (two) times daily. If needed for tonsillitis  20 capsule  0   No facility-administered encounter medications on file as of 06/12/2012.    EXAM:  BP 144/100  Pulse 78  Temp(Src) 98.3 F (36.8 C) (Oral)  Wt 193 lb (87.544 kg)  BMI 31.17 kg/m2  SpO2 99%  Body mass index is 31.17 kg/(m^2). Wt Readings from Last 3 Encounters:  06/12/12 193 lb (87.544 kg)  04/09/12 183 lb 6 oz (83.178 kg)  03/26/12 182 lb 3.2 oz (82.645 kg)    WDWN in NAD  quiet respirations; mildly congested  hoarse. Non toxic . Here with  Daughter who is wel. HEENT: Normocephalic ;atraumatic , Eyes;  PERRL, EOMs  Full, lids and conjunctiva clear,,Ears: no deformities, canals nl, TM landmarks  normal, scarring left tm  Nose: no deformity or discharge but congested;face non  tender Mouth : OP clear without lesion or edema . Tonsil 2+ left more than right no exudfate or ulcer .   Neck: Supple tender left ac node 1+ no pc or  Other masses  Chest:  Clear to A&P without wheezes rales or rhonchi CV:  S1-S2 no gallops or murmurs peripheral perfusion is normal Skin :nl perfusion and no acute rashes  PSYCH: pleasant and cooperative, no obvious depression or anxiety RS negative  ASSESSMENT AND PLAN:  Discussed the following assessment and plan:  Acute respiratory infection  Acute pharyngitis - left more than right  poss earlyu tonsillitis has hypertrophied tonsils. r/o strep - Plan: POCT rapid strep A  Reactive cervical lymphadenopathy  HYPERTENSION - bp has been good with weigh tloss has been on sudafed check at home to ensure control   -Patient advised to return or notify health care team  if symptoms worsen or persist or new concerns arise.  Patient Instructions  This is probably a viral respiratory infection bu  context and exa. However  Because of the unilateral tonsil pain and gland tenderness you could be getting a bacterial tonsillitis.    If left throat pain and swelling  In continuing and getting worse can add antibiotic for this part .   Otherwise voice rests and  Comfort measures. Cough and congestion can last weeks .  Contact us with alarm features if needed      Neta Mends. Ladarian Bonczek M.D.

## 2012-10-01 ENCOUNTER — Ambulatory Visit (INDEPENDENT_AMBULATORY_CARE_PROVIDER_SITE_OTHER): Payer: Commercial Managed Care - PPO | Admitting: Physician Assistant

## 2012-10-01 ENCOUNTER — Encounter (INDEPENDENT_AMBULATORY_CARE_PROVIDER_SITE_OTHER): Payer: Self-pay

## 2012-10-01 VITALS — BP 132/80 | HR 68 | Temp 98.4°F | Resp 18 | Ht 66.0 in | Wt 184.2 lb

## 2012-10-01 DIAGNOSIS — Z4651 Encounter for fitting and adjustment of gastric lap band: Secondary | ICD-10-CM

## 2012-10-01 NOTE — Patient Instructions (Signed)
Return in two months. Focus on good food choices as well as physical activity. Return sooner if you have an increase in hunger, portion sizes or weight. Return also for difficulty swallowing, night cough, reflux.   

## 2012-10-01 NOTE — Progress Notes (Signed)
  HISTORY: [name] is a 37 y.o.female who received an AP-Standard lap-band in November 2010 by Dr. Ezzard Standing. She comes in with a 2 month history of worsening heartburn, almost daily. She has taken prilosec, tagamet and tums with little durable relief. She is able to tolerate solids but she's eating only twice a day or so just because she has little hunger. She is training for a half-marathon as well as traveling by air so she's concerned about being over-restricted.  VITAL SIGNS: Filed Vitals:   10/01/12 1023  BP: 132/80  Pulse: 68  Temp: 98.4 F (36.9 C)  Resp: 18    PHYSICAL EXAM: Physical exam reveals a very well-appearing 37 y.o.female in no apparent distress Neurologic: Awake, alert, oriented Psych: Bright affect, conversant Respiratory: Breathing even and unlabored. No stridor or wheezing Abdomen: Soft, nontender, nondistended to palpation. Incisions well-healed. No incisional hernias. Port easily palpated. Extremities: Atraumatic, good range of motion.  ASSESMENT: 37 y.o.  female  s/p AP-Standard lap-band.   PLAN: The patient's port was accessed with a 20G Huber needle without difficulty. Clear fluid was aspirated and 2 mL saline was removed from the port to give a total predicted volume of 0 mL. The patient was advised to concentrate on healthy food choices and to avoid slider foods high in fats and carbohydrates. Her last two UGI studies revealed no particular abnormalities other than some esophageal dysmotility. I'm not presently concerned about a slip. I asked her to take OTC nexium for the next couple of weeks and to return to see me after she completes her air travel. She voiced agreement and understanding

## 2012-10-08 ENCOUNTER — Encounter (INDEPENDENT_AMBULATORY_CARE_PROVIDER_SITE_OTHER): Payer: Commercial Managed Care - PPO

## 2012-12-03 ENCOUNTER — Encounter (INDEPENDENT_AMBULATORY_CARE_PROVIDER_SITE_OTHER): Payer: Commercial Managed Care - PPO

## 2012-12-10 ENCOUNTER — Encounter (INDEPENDENT_AMBULATORY_CARE_PROVIDER_SITE_OTHER): Payer: Self-pay

## 2012-12-10 ENCOUNTER — Ambulatory Visit (INDEPENDENT_AMBULATORY_CARE_PROVIDER_SITE_OTHER): Payer: Commercial Managed Care - PPO | Admitting: Physician Assistant

## 2012-12-10 VITALS — BP 121/79 | HR 63 | Temp 97.7°F | Resp 12 | Ht 66.0 in | Wt 182.4 lb

## 2012-12-10 DIAGNOSIS — Z4651 Encounter for fitting and adjustment of gastric lap band: Secondary | ICD-10-CM

## 2012-12-10 NOTE — Patient Instructions (Signed)

## 2012-12-10 NOTE — Progress Notes (Signed)
  HISTORY: [name] is a 37 y.o.female who received an AP-Standard lap-band in November 2010 by Dr. Ezzard Standing. She has returned from air travel over the summer and is ready for fluid replacement in the band. She had been taking nexium but noticed that her reflux returned after discontinuing the medication. She has no regurgitation or obstruction whatsoever. She wants to get the 2 mL replaced to help keep her weight under control. She is training for a half-marathon in November.  VITAL SIGNS: Filed Vitals:   12/10/12 0918  BP: 121/79  Pulse: 63  Temp: 97.7 F (36.5 C)  Resp: 12    PHYSICAL EXAM: Physical exam reveals a very well-appearing 37 y.o.female in no apparent distress Neurologic: Awake, alert, oriented Psych: Bright affect, conversant Respiratory: Breathing even and unlabored. No stridor or wheezing Abdomen: Soft, nontender, nondistended to palpation. Incisions well-healed. No incisional hernias. Port easily palpated. Extremities: Atraumatic, good range of motion.  ASSESMENT: 37 y.o.  female  s/p AP-Standard lap-band.   PLAN: The patient's port was accessed with a 20G Huber needle without difficulty. Clear fluid was aspirated and 2 mL saline was added to the port to give a total predicted volume of 2 mL. The patient was able to swallow water without difficulty following the procedure and was instructed to take clear liquids for the next 24-48 hours and advance slowly as tolerated. She is going to resume otc nexium as this helps her reflux symptoms. She has had two upper GI studies showing no obstruction or slip, but some esophageal dysmotility. I advised her to follow-up with her PCP for possible GI referral if her reflux persists. We'll have her back in six months.

## 2013-05-11 ENCOUNTER — Encounter (INDEPENDENT_AMBULATORY_CARE_PROVIDER_SITE_OTHER): Payer: Self-pay | Admitting: Physician Assistant

## 2013-09-02 ENCOUNTER — Encounter (INDEPENDENT_AMBULATORY_CARE_PROVIDER_SITE_OTHER): Payer: Commercial Managed Care - PPO

## 2013-09-09 ENCOUNTER — Encounter (INDEPENDENT_AMBULATORY_CARE_PROVIDER_SITE_OTHER): Payer: Commercial Managed Care - PPO

## 2015-02-08 ENCOUNTER — Telehealth (HOSPITAL_COMMUNITY): Payer: Self-pay

## 2015-02-08 NOTE — Telephone Encounter (Signed)
This patient is overdue for recommended follow-up with a bariatric surgeon [facility]. E-mail was sent to the patient 01-11-15 from [facility] advising the patient on the benefits of follow-up care and directing them to call [facility] at [phone] to schedule an appointment at their earliest convenience. Patient responded to e-mail today advising [place] [facility] & transferred her post-op care to Dr. Dewaine Cong[facility]Barker w/ [facility] [place]. Scheduled for corrective surgery for band slip on Nov 11th.  I forwarded the email to [employer] & Blanca with [facility].  Jim LikeAmanda T. [facility] Coordinator [phone]

## 2015-03-03 LAB — AMB EXT CREATININE: Creatinine, External: 0.69

## 2015-03-13 LAB — AMB EXT HGBA1C: Hemoglobin A1c, External: 5.3

## 2015-03-13 LAB — AMB EXT CREATININE: Creatinine, External: 0.7

## 2015-04-27 ENCOUNTER — Ambulatory Visit: Admit: 2015-04-27 | Discharge: 2015-04-27 | Payer: PRIVATE HEALTH INSURANCE | Attending: Internal Medicine

## 2015-04-27 DIAGNOSIS — Z0000 Encounter for general adult medical examination without abnormal findings: Secondary | ICD-10-CM

## 2015-04-27 NOTE — Patient Instructions (Signed)
The Fast Metabolism Diet by Hallie Pomroy

## 2015-04-27 NOTE — Progress Notes (Signed)
Chief Complaint   Patient presents with   ??? New Patient   ??? Hypertension

## 2015-04-27 NOTE — Progress Notes (Signed)
HPI:  Vanessa Mathis is a 40 y.o. year old female who is a new patient and is here to establish care. Will request records.  The following sections were reviewed & updated as appropriate: PMH, PL, PSH, and SH.    She is working out at [employer].   Sees Dr Oren Binet for gyn care. Last pap 11/16. She has discussed starting contrave with Dr Claudean Severance. Last mammogram 03/06/15.   Had lap band removed mid Nov by Dr Dewaine Conger as is had slipped and was causing gerd symptoms.  She had the lab band put in 6 years ago in . Her heart burn has resolved with removal of the lap band. She has gained about 3 pounds since lap band removed.   She is checking BP at home and is running 120/80 's range.   Current Outpatient Prescriptions   Medication Sig Dispense Refill   ??? ergocalciferol (ERGOCALCIFEROL) 50,000 unit capsule        Not on File  Past Medical History   Diagnosis Date   ??? Depression    ??? Gallbladder calculus    ??? Reflux gastritis    ??? Sinus pain      Past Surgical History   Procedure Laterality Date   ??? Hx cesarean section  2006 & 2009   ??? Hx cholecystectomy  12/2007   ??? Hx lap gastric bypass  02/2009   ??? Hx other surgical  02/2015     lapband removal     Family History   Problem Relation Age of Onset   ??? Depression Mother    ??? Cancer Father    ??? Heart Disease Father    ??? Stroke Maternal Grandmother    ??? Heart Disease Maternal Grandfather      Social History   Substance Use Topics   ??? Smoking status: Former Smoker     Quit date: 04/26/2000   ??? Smokeless tobacco: Never Used   ??? Alcohol use 2.4 oz/week     2 Glasses of wine, 2 Cans of beer per week             Review of Systems   Constitutional: Negative for chills, fever and malaise/fatigue.   HENT: Negative for congestion, ear pain, hearing loss and sore throat.    Eyes: Negative for blurred vision and double vision.   Respiratory: Negative for cough, shortness of breath and wheezing.    Cardiovascular: Negative for chest pain, palpitations and leg swelling.    Gastrointestinal: Negative for abdominal pain, blood in stool, constipation, diarrhea, heartburn, nausea and vomiting.   Genitourinary: Negative for dysuria, frequency and urgency.   Musculoskeletal: Negative for back pain, joint pain and myalgias.   Skin: Negative for rash.   Neurological: Negative for dizziness, sensory change, focal weakness and headaches.   Psychiatric/Behavioral: Negative for depression. The patient is not nervous/anxious and does not have insomnia.          Physical Exam   Constitutional: She appears well-developed. No distress.   BP 132/86  Pulse 86  Temp 98.8 ??F (37.1 ??C) (Oral)   Resp 16  Ht 5' 6.2" (1.681 m)  Wt 199 lb (90.3 kg)  LMP 04/04/2015 (Exact Date)  SpO2 98%  BMI 31.93 kg/m2   HENT:   Head: Normocephalic and atraumatic.   Right Ear: Tympanic membrane and ear canal normal.   Left Ear: Tympanic membrane and ear canal normal.   Nose: Nose normal.   Mouth/Throat: Oropharynx is clear and moist.   Eyes: Conjunctivae  and EOM are normal. Pupils are equal, round, and reactive to light.   Neck: Normal range of motion. No thyromegaly present.   Cardiovascular: Normal rate, regular rhythm, normal heart sounds and intact distal pulses.    No murmur heard.  Pulmonary/Chest: Effort normal and breath sounds normal.   Abdominal: Soft. Bowel sounds are normal. She exhibits no mass. There is no tenderness.   Musculoskeletal: She exhibits no edema or tenderness.   Lymphadenopathy:     She has no cervical adenopathy.   Neurological: She has normal strength. No cranial nerve deficit or sensory deficit.   Skin: No rash noted.   Psychiatric: She has a normal mood and affect.   Vitals reviewed.       Assessment & Plan:    ICD-10-CM ICD-9-CM    1. Routine general medical examination at a health care facility Z00.00 V70.0    2. Obesity (BMI 30-39.9) E66.9 278.00    will request most recent lab work and medical records.   Discussed the patient's above normal BMI with her.  I have recommended the  following interventions: dietary management education, guidance, and counseling .  The BMI follow up plan is as follows: BMI is out of normal parameters and plan is as follows: I have counseled this patient on diet and exercise regimens     Follow-up Disposition:  Return in about 6 months (around 10/25/2015) for follow up.   Advised her to call back or return to office if symptoms worsen/change/persist.  Discussed expected course/resolution/complications of diagnosis in detail with patient.    Medication risks/benefits/costs/interactions/alternatives discussed with patient.  She was given an after visit summary which includes diagnoses, current medications, & vitals.  She expressed understanding with the diagnosis and plan.

## 2015-10-30 ENCOUNTER — Encounter: Attending: Internal Medicine

## 2015-10-31 ENCOUNTER — Encounter: Attending: Internal Medicine

## 2015-11-17 ENCOUNTER — Ambulatory Visit: Admit: 2015-11-17 | Payer: PRIVATE HEALTH INSURANCE | Attending: Internal Medicine

## 2015-11-17 DIAGNOSIS — E669 Obesity, unspecified: Secondary | ICD-10-CM

## 2015-11-17 NOTE — Progress Notes (Signed)
Chief Complaint   Patient presents with   ??? Weight Management     1. Have you been to the ER, urgent care clinic since your last visit?  Hospitalized since your last visit?No    2. Have you seen or consulted any other health care providers outside of the [facility] since your last visit?  Include any pap smears or colon screening. No

## 2015-11-17 NOTE — Patient Instructions (Signed)
Valere Dross, MD    External Physician         ?? Specialty   ?? Endocrinology, Internal Medicine   ??   Primary Contact Information    ?? Phone Fax E-mail Address   ?? [phone] Not available. [address]   ??  [facility]   ??  [place] [address]   ??

## 2015-11-17 NOTE — Progress Notes (Signed)
HPI:  Vanessa Mathis is a 40 y.o. year old female who returns to clinic today for follow up: obesity and borderline BP.   She notes since last seen tried contrave by Dr Claudean Severance but had side effects. She has been working out 4-5 days a week at [employer] but states she is a stress eater and has difficulty managing dietary changes. She has failed gastric banding. Has gained 14 pounds over the last 7 months.   Check BP 148/81, 130/76, 137/81, 124/70, 140/82 recently.   Current Outpatient Prescriptions   Medication Sig Dispense Refill   ??? ergocalciferol (ERGOCALCIFEROL) 50,000 unit capsule        Not on File  Social History   Substance Use Topics   ??? Smoking status: Former Smoker     Quit date: 04/26/2000   ??? Smokeless tobacco: Never Used   ??? Alcohol use 2.4 oz/week     2 Glasses of wine, 2 Cans of beer per week         Review of Systems   Constitutional: Negative for malaise/fatigue.   Respiratory: Negative for shortness of breath.    Cardiovascular: Negative for chest pain and leg swelling.   Gastrointestinal: Negative for abdominal pain and heartburn.   Neurological: Negative for dizziness and headaches.       Physical Exam   Constitutional: She appears well-developed. No distress.   BP 136/88   Pulse 86   Temp 98.2 ??F (36.8 ??C)   Resp 16   Ht 5' 6.2" (1.681 m)   Wt 213 lb 12.8 oz (97 kg)   LMP 10/29/2015   SpO2 98%   BMI 34.3 kg/m2   Neck: Carotid bruit is not present. No thyromegaly present.   Cardiovascular: Normal rate, regular rhythm, normal heart sounds and intact distal pulses.    No murmur heard.  Pulmonary/Chest: Effort normal and breath sounds normal. She has no wheezes.   Abdominal: Soft. Bowel sounds are normal. She exhibits no distension and no mass. There is no tenderness.   Musculoskeletal: She exhibits no edema.   Psychiatric: She has a normal mood and affect.   Vitals reviewed.        Assessment & Plan:    ICD-10-CM ICD-9-CM    1. Obesity (BMI 30-39.9)   I have reviewed/discussed the above normal BMI with the patient.  I have recommended the following interventions: dietary management education, guidance, and counseling . referred to Dr Jess Barters wt loss center. She is agreeable to plan. .    E66.9 278.00 METABOLIC PANEL, COMPREHENSIVE      LIPID PANEL      CBC WITH AUTOMATED DIFF      HEMOGLOBIN A1C WITH EAG      TSH 3RD GENERATION      T4, FREE   2. Wellness lab work Z00.00 V70.0 METABOLIC PANEL, COMPREHENSIVE      LIPID PANEL      CBC WITH AUTOMATED DIFF      HEMOGLOBIN A1C WITH EAG      TSH 3RD GENERATION      T4, FREE    3.  Borderline BP   Counseled avoid salt and wt loss. She will work on lifestyle changes. Will continue to monitor and recheck in 6 months.     Follow-up Disposition:  Return in about 6 months (around 05/19/2016) for physical.   Advised her to call back or return to office if symptoms worsen/change/persist.  Discussed expected course/resolution/complications of diagnosis in detail with patient.  Medication risks/benefits/costs/interactions/alternatives discussed with patient.  She was given an after visit summary which includes diagnoses, current medications, & vitals.  She expressed understanding with the diagnosis and plan.

## 2015-11-18 LAB — CBC WITH AUTOMATED DIFF
ABS. BASOPHILS: 0 10*3/uL (ref 0.0–0.2)
ABS. EOSINOPHILS: 0.2 10*3/uL (ref 0.0–0.4)
ABS. IMM. GRANS.: 0 10*3/uL (ref 0.0–0.1)
ABS. MONOCYTES: 0.4 10*3/uL (ref 0.1–0.9)
ABS. NEUTROPHILS: 3.2 10*3/uL (ref 1.4–7.0)
Abs Lymphocytes: 2.1 10*3/uL (ref 0.7–3.1)
BASOPHILS: 1 %
EOSINOPHILS: 3 %
HCT: 42.1 % (ref 34.0–46.6)
HGB: 13.7 g/dL (ref 11.1–15.9)
IMMATURE GRANULOCYTES: 0 %
Lymphocytes: 35 %
MCH: 30.1 pg (ref 26.6–33.0)
MCHC: 32.5 g/dL (ref 31.5–35.7)
MCV: 93 fL (ref 79–97)
MONOCYTES: 7 %
NEUTROPHILS: 54 %
PLATELET: 248 10*3/uL (ref 150–379)
RBC: 4.55 x10E6/uL (ref 3.77–5.28)
RDW: 13.8 % (ref 12.3–15.4)
WBC: 5.9 10*3/uL (ref 3.4–10.8)

## 2015-11-18 LAB — HEMOGLOBIN A1C WITH EAG
Estimated average glucose: 91 mg/dL
Hemoglobin A1c: 4.8 % (ref 4.8–5.6)

## 2015-11-18 LAB — LIPID PANEL
Cholesterol, total: 260 mg/dL — ABNORMAL HIGH (ref 100–199)
HDL Cholesterol: 73 mg/dL (ref >39)
LDL, calculated: 173 mg/dL — ABNORMAL HIGH (ref 0–99)
Triglyceride: 71 mg/dL (ref 0–149)
VLDL, calculated: 14 mg/dL (ref 5–40)

## 2015-11-18 LAB — METABOLIC PANEL, COMPREHENSIVE
A-G Ratio: 1.6 (ref 1.2–2.2)
ALT (SGPT): 13 IU/L (ref 0–32)
AST (SGOT): 14 IU/L (ref 0–40)
Albumin: 4.2 g/dL (ref 3.5–5.5)
Alk. phosphatase: 61 IU/L (ref 39–117)
BUN/Creatinine ratio: 14 (ref 9–23)
BUN: 10 mg/dL (ref 6–24)
Bilirubin, total: 0.3 mg/dL (ref 0.0–1.2)
CO2: 21 mmol/L (ref 18–29)
Calcium: 8.7 mg/dL (ref 8.7–10.2)
Chloride: 102 mmol/L (ref 96–106)
Creatinine: 0.72 mg/dL (ref 0.57–1.00)
GFR est AA: 121 mL/min/{1.73_m2} (ref >59)
GFR est non-AA: 105 mL/min/{1.73_m2} (ref >59)
GLOBULIN, TOTAL: 2.6 g/dL (ref 1.5–4.5)
Glucose: 80 mg/dL (ref 65–99)
Potassium: 4.7 mmol/L (ref 3.5–5.2)
Protein, total: 6.8 g/dL (ref 6.0–8.5)
Sodium: 139 mmol/L (ref 134–144)

## 2015-11-18 LAB — TSH 3RD GENERATION: TSH: 2.24 u[IU]/mL (ref 0.450–4.500)

## 2015-11-18 LAB — T4, FREE: T4, Free: 1.22 ng/dL (ref 0.82–1.77)

## 2015-12-04 NOTE — Progress Notes (Signed)
Letter sent to patient.

## 2016-05-21 ENCOUNTER — Encounter: Attending: Internal Medicine

## 2016-08-05 ENCOUNTER — Ambulatory Visit: Admit: 2016-08-05 | Discharge: 2016-08-05 | Payer: PRIVATE HEALTH INSURANCE | Attending: Internal Medicine

## 2016-08-05 DIAGNOSIS — I10 Essential (primary) hypertension: Secondary | ICD-10-CM

## 2016-08-05 MED ORDER — AMLODIPINE 5 MG TAB
5 mg | ORAL_TABLET | Freq: Every day | ORAL | 0 refills | Status: DC
Start: 2016-08-05 — End: 2016-09-03

## 2016-08-05 NOTE — Progress Notes (Signed)
Acute Care Note    Vanessa Mathis is 41 y.o. female. she presents for evaluation of Hypertension    The patient reports having had the onset of elevated blood pressures for the past week.  Pressures have been in the 140-160's systolic and 90's diastolic.  She reports no significant change in her diet over the past weeks.  She has just recently started Weight Watchers a few days ago and is using the point system.  She tells me that she had been taking a supplement called goldenseal for acne which she was concerned could lead to higher blood pressures.  She stopped this medication a week ago.   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  The patient acknowledges some stress related to this.       Prior to Admission medications    Medication Sig Start Date End Date Taking? Authorizing Provider   TAYTULLA 1 mg-20 mcg (24)/75 mg (4) cap  07/31/16  Yes Historical Provider   cyanocobalamin (VITAMIN B-12) 1,000 mcg tablet Take 1,000 mcg by mouth daily.   Yes Historical Provider   ergocalciferol (ERGOCALCIFEROL) 50,000 unit capsule  02/08/15  Yes Historical Provider         Patient Active Problem List   Diagnosis Code   ??? Essential hypertension I10         Review of Systems   Constitutional: Negative.    Cardiovascular: Negative.    Neurological: Positive for headaches (mild).         Visit Vitals   ??? BP (!) 170/99 (BP 1 Location: Right arm, BP Patient Position: Sitting)   ??? Pulse 78   ??? Temp 98.1 ??F (36.7 ??C) (Oral)   ??? Resp 18   ??? Ht 5' 6.2" (1.681 m)   ??? Wt 219 lb 6.4 oz (99.5 kg)   ??? LMP 08/05/2016   ??? SpO2 98%   ??? BMI 35.2 kg/m2       Physical Exam   Constitutional: She is oriented to person, place, and time. No distress.   HENT:   Mouth/Throat: Oropharynx is clear and moist.   Cardiovascular: Normal rate and regular rhythm.    Pulmonary/Chest: Effort normal and breath sounds normal.   Neurological: She is alert and oriented to person, place, and time.            ASSESSMENT/PLAN  Diagnoses and all orders for this visit:    1. Essential hypertension - Advised the patient that I will begin Norvasc for her today.  She should make a follow up appointment with Dr. Cliffton Asters to evaluate effectiveness and to consider additional treatment if neccessary.   -     amLODIPine (NORVASC) 5 mg tablet; Take 1 Tab by mouth daily.         Advised the patient to call back or return to office if symptoms worsen/change/persist.   Discussed expected course/resolution/complications of diagnosis in detail with patient.     Medication risks/benefits/costs/interactions/alternatives discussed with patient.   The patient was given an after visit summary which includes diagnoses, current medications, & vitals.   They expressed understanding with the diagnosis and plan.

## 2016-08-05 NOTE — Patient Instructions (Signed)
DASH Diet: Care Instructions  Your Care Instructions    The DASH diet is an eating plan that can help lower your blood pressure. DASH stands for Dietary Approaches to Stop Hypertension. Hypertension is high blood pressure.  The DASH diet focuses on eating foods that are high in calcium, potassium, and magnesium. These nutrients can lower blood pressure. The foods that are highest in these nutrients are fruits, vegetables, low-fat dairy products, nuts, seeds, and legumes. But taking calcium, potassium, and magnesium supplements instead of eating foods that are high in those nutrients does not have the same effect. The DASH diet also includes whole grains, fish, and poultry.  The DASH diet is one of several lifestyle changes your doctor may recommend to lower your high blood pressure. Your doctor may also want you to decrease the amount of sodium in your diet. Lowering sodium while following the DASH diet can lower blood pressure even further than just the DASH diet alone.  Follow-up care is a key part of your treatment and safety. Be sure to make and go to all appointments, and call your doctor if you are having problems. It's also a good idea to know your test results and keep a list of the medicines you take.  How can you care for yourself at home?  Following the DASH diet  ?? Eat 4 to 5 servings of fruit each day. A serving is 1 medium-sized piece of fruit, ?? cup chopped or canned fruit, 1/4 cup dried fruit, or 4 ounces (?? cup) of fruit juice. Choose fruit more often than fruit juice.  ?? Eat 4 to 5 servings of vegetables each day. A serving is 1 cup of lettuce or raw leafy vegetables, ?? cup of chopped or cooked vegetables, or 4 ounces (?? cup) of vegetable juice. Choose vegetables more often than vegetable juice.  ?? Get 2 to 3 servings of low-fat and fat-free dairy each day. A serving is 8 ounces of milk, 1 cup of yogurt, or 1 ?? ounces of cheese.   ?? Eat 6 to 8 servings of grains each day. A serving is 1 slice of bread, 1 ounce of dry cereal, or ?? cup of cooked rice, pasta, or cooked cereal. Try to choose whole-grain products as much as possible.  ?? Limit lean meat, poultry, and fish to 2 servings each day. A serving is 3 ounces, about the size of a deck of cards.  ?? Eat 4 to 5 servings of nuts, seeds, and legumes (cooked dried beans, lentils, and split peas) each week. A serving is 1/3 cup of nuts, 2 tablespoons of seeds, or ?? cup of cooked beans or peas.  ?? Limit fats and oils to 2 to 3 servings each day. A serving is 1 teaspoon of vegetable oil or 2 tablespoons of salad dressing.  ?? Limit sweets and added sugars to 5 servings or less a week. A serving is 1 tablespoon jelly or jam, ?? cup sorbet, or 1 cup of lemonade.  ?? Eat less than 2,300 milligrams (mg) of sodium a day. If you limit your sodium to 1,500 mg a day, you can lower your blood pressure even more.  Tips for success  ?? Start small. Do not try to make dramatic changes to your diet all at once. You might feel that you are missing out on your favorite foods and then be more likely to not follow the plan. Make small changes, and stick with them. Once those changes become habit, add a   few more changes.  ?? Try some of the following:  ?? Make it a goal to eat a fruit or vegetable at every meal and at snacks. This will make it easy to get the recommended amount of fruits and vegetables each day.  ?? Try yogurt topped with fruit and nuts for a snack or healthy dessert.  ?? Add lettuce, tomato, cucumber, and onion to sandwiches.  ?? Combine a ready-made pizza crust with low-fat mozzarella cheese and lots of vegetable toppings. Try using tomatoes, squash, spinach, broccoli, carrots, cauliflower, and onions.  ?? Have a variety of cut-up vegetables with a low-fat dip as an appetizer instead of chips and dip.  ?? Sprinkle sunflower seeds or chopped almonds over salads. Or try adding  chopped walnuts or almonds to cooked vegetables.  ?? Try some vegetarian meals using beans and peas. Add garbanzo or kidney beans to salads. Make burritos and tacos with mashed pinto beans or black beans.  Where can you learn more?  Go to http://www.healthwise.net/GoodHelp[employer]ctions.  Enter H967 in the search box to learn more about "DASH Diet: Care Instructions."  Current as of: January 11, 2015  Content Version: 11.4  ?? 2006-2017 [employer]. Care instructions adapted under license by [employer] (which disclaims liability or warranty for this information). If you have questions about a medical condition or this instruction, always ask your healthcare professional. [employer] disclaims any warranty or liability for your use of this information.

## 2016-09-01 ENCOUNTER — Encounter

## 2016-09-03 ENCOUNTER — Ambulatory Visit: Admit: 2016-09-03 | Discharge: 2016-09-03 | Payer: PRIVATE HEALTH INSURANCE | Attending: Internal Medicine

## 2016-09-03 DIAGNOSIS — I10 Essential (primary) hypertension: Secondary | ICD-10-CM

## 2016-09-03 MED ORDER — HYDROCHLOROTHIAZIDE 25 MG TAB
25 mg | ORAL_TABLET | Freq: Every day | ORAL | 1 refills | Status: DC
Start: 2016-09-03 — End: 2016-10-08

## 2016-09-03 MED ORDER — AMLODIPINE 5 MG TAB
5 mg | ORAL_TABLET | Freq: Every day | ORAL | 2 refills | Status: DC
Start: 2016-09-03 — End: 2016-10-08

## 2016-09-03 NOTE — Telephone Encounter (Signed)
She was seen by Dr. Hart RochesterLawson who initiated medication for hypertension newly diagnosed.  She was supposed to see me in 4 weeks and has not made an appointment.  Please call her to make an appointment and send back to me for refill.

## 2016-09-03 NOTE — Telephone Encounter (Signed)
Further refill per Dr. Cliffton AstersWhite

## 2016-09-03 NOTE — Progress Notes (Signed)
Chief Complaint   Patient presents with   ??? Hypertension   ??? Medication Evaluation   ??? Foot Swelling     bilateral     Patient states she is here for follow up on HTN and medication evaluation.  Patient has been taking her BP at home and states the medication doesn't seem to be working.  Patient states has been having swelling in both feet.

## 2016-09-03 NOTE — Progress Notes (Signed)
HPI:  Vanessa Mathis is a 41 y.o. year old female who returns to clinic today for follow up: HTN.  With initiation of amlodipine BP 110-160/80-100 still elevated.  Avoiding salt and is walking and is on wt on wt watchers and has lost 8 pounds since 1/18.   She is noticing some increased swelling in hands and feet but started before amlodipine started.     Current Outpatient Prescriptions   Medication Sig Dispense Refill   ??? amoxicillin-clavulanate (AUGMENTIN) 875-125 mg per tablet      ??? TAYTULLA 1 mg-20 mcg (24)/75 mg (4) cap      ??? cyanocobalamin (VITAMIN B-12) 1,000 mcg tablet Take 1,000 mcg by mouth daily.     ??? amLODIPine (NORVASC) 5 mg tablet Take 1 Tab by mouth daily. 30 Tab 0     Allergies   Allergen Reactions   ??? Codeine Nausea Only     Social History   Substance Use Topics   ??? Smoking status: Former Smoker     Quit date: 04/26/2000   ??? Smokeless tobacco: Never Used   ??? Alcohol use 2.4 oz/week     2 Glasses of wine, 2 Cans of beer per week         Review of Systems   Constitutional: Negative for malaise/fatigue.   Respiratory: Negative for shortness of breath.    Cardiovascular: Negative for chest pain.   Gastrointestinal: Negative for abdominal pain and heartburn.   Neurological: Negative for dizziness and headaches.       Physical Exam   Constitutional: She appears well-developed. No distress.   BP 144/88   Pulse 86   Temp 98.1 ??F (36.7 ??C) (Oral)    Resp 16   Ht 5' 6.2" (1.681 m)   Wt 221 lb (100.2 kg)   LMP 08/29/2016 (Approximate)   SpO2 98%   BMI 35.46 kg/m2   Neck: Carotid bruit is not present.   Cardiovascular: Normal rate, regular rhythm, normal heart sounds and intact distal pulses.    No murmur heard.  Pulmonary/Chest: Effort normal and breath sounds normal. She has no wheezes.   Abdominal: Soft. Bowel sounds are normal. She exhibits no distension and no mass. There is no tenderness.   Musculoskeletal: She exhibits edema (Bilateral trace ankle edema).   Psychiatric: She has a normal mood and affect.    Vitals reviewed.        Assessment & Plan:    ICD-10-CM ICD-9-CM    1. Essential hypertension  Not at goal.  Will continue with amlodipine and add hydrochlorothiazide 25 mg every morning.  Counseled regarding side effects of medication.  Counseled to eat a potassium diet.  Check blood pressures and follow-up in 4 weeks. I10 401.9 METABOLIC PANEL, BASIC      TSH 3RD GENERATION      T4, FREE      amLODIPine (NORVASC) 5 mg tablet   2. Severe obesity (BMI 35.0-39.9) with comorbidity (HCC)  Counseled regarding elevated BMI and current weight goals.  Reviewed weight loss strategies including dietary changes and exercise.    E66.01 278.01    3. Swelling of lower extremity  Mild and does not appear to be related amlodipine and she has had prior to initiation of medication.  Encouraged her to avoid salt and lose weight.  Elevate legs at the end of the day. M79.89 729.81         Follow-up Disposition:  Return in about 4 weeks (around 10/01/2016) for follow up.   Advised  her to call back or return to office if symptoms worsen/change/persist.  Discussed expected course/resolution/complications of diagnosis in detail with patient.    Medication risks/benefits/costs/interactions/alternatives discussed with patient.  She was given an after visit summary which includes diagnoses, current medications, & vitals.  She expressed understanding with the diagnosis and plan.

## 2016-09-03 NOTE — Patient Instructions (Addendum)
High Blood Pressure: Care Instructions  Your Care Instructions    If your blood pressure is usually above 140/90, you have high blood pressure, or hypertension. That means the top number is 140 or higher or the bottom number is 90 or higher, or both.  Despite what a lot of people think, high blood pressure usually doesn't cause headaches or make you feel dizzy or lightheaded. It usually has no symptoms. But it does increase your risk for heart attack, stroke, and kidney or eye damage. The higher your blood pressure, the more your risk increases.  Your doctor will give you a goal for your blood pressure. Your goal will be based on your health and your age. An example of a goal is to keep your blood pressure below 140/90.  Lifestyle changes, such as eating healthy and being active, are always important to help lower blood pressure. You might also take medicine to reach your blood pressure goal.  Follow-up care is a key part of your treatment and safety. Be sure to make and go to all appointments, and call your doctor if you are having problems. It's also a good idea to know your test results and keep a list of the medicines you take.  How can you care for yourself at home?  Medical treatment  ?? If you stop taking your medicine, your blood pressure will go back up. You may take one or more types of medicine to lower your blood pressure. Be safe with medicines. Take your medicine exactly as prescribed. Call your doctor if you think you are having a problem with your medicine.  ?? Talk to your doctor before you start taking aspirin every day. Aspirin can help certain people lower their risk of a heart attack or stroke. But taking aspirin isn't right for everyone, because it can cause serious bleeding.  ?? See your doctor regularly. You may need to see the doctor more often at first or until your blood pressure comes down.  ?? If you are taking blood pressure medicine, talk to your doctor before  you take decongestants or anti-inflammatory medicine, such as ibuprofen. Some of these medicines can raise blood pressure.  ?? Learn how to check your blood pressure at home.  Lifestyle changes  ?? Stay at a healthy weight. This is especially important if you put on weight around the waist. Losing even 10 pounds can help you lower your blood pressure.  ?? If your doctor recommends it, get more exercise. Walking is a good choice. Bit by bit, increase the amount you walk every day. Try for at least 30 minutes on most days of the week. You also may want to swim, bike, or do other activities.  ?? Avoid or limit alcohol. Talk to your doctor about whether you can drink any alcohol.  ?? Try to limit how much sodium you eat to less than 2,300 milligrams (mg) a day. Your doctor may ask you to try to eat less than 1,500 mg a day.  ?? Eat plenty of fruits (such as bananas and oranges), vegetables, legumes, whole grains, and low-fat dairy products.  ?? Lower the amount of saturated fat in your diet. Saturated fat is found in animal products such as milk, cheese, and meat. Limiting these foods may help you lose weight and also lower your risk for heart disease.  ?? Do not smoke. Smoking increases your risk for heart attack and stroke. If you need help quitting, talk to your doctor about stop-smoking programs   and medicines. These can increase your chances of quitting for good.  When should you call for help?  Call 911 anytime you think you may need emergency care. This may mean having symptoms that suggest that your blood pressure is causing a serious heart or blood vessel problem. Your blood pressure may be over 180/110.  ?For example, call 911 if:  ? ?? You have symptoms of a heart attack. These may include:  ?? Chest pain or pressure, or a strange feeling in the chest.  ?? Sweating.  ?? Shortness of breath.  ?? Nausea or vomiting.  ?? Pain, pressure, or a strange feeling in the back, neck, jaw, or upper  belly or in one or both shoulders or arms.  ?? Lightheadedness or sudden weakness.  ?? A fast or irregular heartbeat.   ? ?? You have symptoms of a stroke. These may include:  ?? Sudden numbness, tingling, weakness, or loss of movement in your face, arm, or leg, especially on only one side of your body.  ?? Sudden vision changes.  ?? Sudden trouble speaking.  ?? Sudden confusion or trouble understanding simple statements.  ?? Sudden problems with walking or balance.  ?? A sudden, severe headache that is different from past headaches.   ? ?? You have severe back or belly pain.   ?Do not wait until your blood pressure comes down on its own. Get help right away.  ?Call your doctor now or seek immediate care if:  ? ?? Your blood pressure is much higher than normal (such as 180/110 or higher), but you don't have symptoms.   ? ?? You think high blood pressure is causing symptoms, such as:  ?? Severe headache.  ?? Blurry vision.   ?Watch closely for changes in your health, and be sure to contact your doctor if:  ? ?? Your blood pressure measures 140/90 or higher at least 2 times. That means the top number is 140 or higher or the bottom number is 90 or higher, or both.   ? ?? You think you may be having side effects from your blood pressure medicine.   ? ?? Your blood pressure is usually normal, but it goes above normal at least 2 times.   Where can you learn more?  Go to [employer]Stats[employer]cahttp://www.healthwise.net/GoodHelpConnections.  Enter ([phone] in the search box to learn more about "High Blood Pressure: Care Instructions."  Current as of: January 11, 2015  Content Version: 11.4  ?? 2006-2017 [employer]. Care instructions adapted under license by [employer] (which disclaims liability or warranty for this information). If you have questions about a medical condition or this instruction, always ask your healthcare professional. [employer] disclaims any warranty or liability for your use of this information.        Potassium-Rich Diet: Care Instructions  Your Care Instructions    Potassium is a mineral. It helps keep the right mix of fluids in your body. It also helps your nerves and muscles work as they should. You'll find it in milk and meats. It's also in all fresh foods, including fruits and vegetables. Most adults need about 5 grams of potassium a day. The foods you eat should supply all that you need.  Some health conditions can cause a loss of potassium. For example, kidney problems and stomach problems with vomiting and diarrhea can cause you to lose this mineral. Some medicines, such as water pills (diuretics), can cause low potassium.  If you can't get enough potassium from  what you eat, your doctor may advise you to take supplements.  Follow-up care is a key part of your treatment and safety. Be sure to make and go to all appointments, and call your doctor if you are having problems. It's also a good idea to know your test results and keep a list of the medicines you take.  How can you care for yourself at home?  ?? Plan your diet around foods that are rich in potassium. Fresh, unprocessed whole foods have the most. These foods include:  ?? Milk and other dairy products.  ?? Vegetables, especially broccoli, cooked dry beans, tomatoes, potatoes, artichokes, winter squash, and spinach.  ?? Fruits, especially citrus fruits, bananas, and apricots. Dried apricots contain more potassium than fresh apricots.  ?? Meat, poultry, and fish.  ?? Ask your doctor about using a salt substitute or "light" salt. These often contain potassium.  Where can you learn more?  Go to [employer]cahttp://www.healthwise.net/GoodHelpConnections.  Enter H315 in the search box to learn more about "Potassium-Rich Diet: Care Instructions."  Current as of: Sep 01, 2015  Content Version: 11.4  ?? 2006-2017 [employer]. Care instructions adapted under license by [employer] (which disclaims liability or warranty  for this information). If you have questions about a medical condition or this instruction, always ask your healthcare professional. [employer] disclaims any warranty or liability for your use of this information.

## 2016-09-07 LAB — T4, FREE: T4, Free: 1.24 ng/dL (ref 0.82–1.77)

## 2016-09-07 LAB — TSH 3RD GENERATION: TSH: 3.51 u[IU]/mL (ref 0.450–4.500)

## 2016-09-07 LAB — METABOLIC PANEL, BASIC
BUN/Creatinine ratio: 15 (ref 9–23)
BUN: 11 mg/dL (ref 6–24)
CO2: 22 mmol/L (ref 18–29)
Calcium: 9.2 mg/dL (ref 8.7–10.2)
Chloride: 98 mmol/L (ref 96–106)
Creatinine: 0.73 mg/dL (ref 0.57–1.00)
GFR est AA: 118 mL/min/{1.73_m2} (ref >59)
GFR est non-AA: 103 mL/min/{1.73_m2} (ref >59)
Glucose: 94 mg/dL (ref 65–99)
Potassium: 4 mmol/L (ref 3.5–5.2)
Sodium: 138 mmol/L (ref 134–144)

## 2016-09-09 NOTE — Progress Notes (Signed)
Letter sent to patient.  Normal lab work

## 2016-10-08 ENCOUNTER — Ambulatory Visit: Admit: 2016-10-08 | Discharge: 2016-10-08 | Payer: PRIVATE HEALTH INSURANCE | Attending: Internal Medicine

## 2016-10-08 DIAGNOSIS — I10 Essential (primary) hypertension: Secondary | ICD-10-CM

## 2016-10-08 MED ORDER — HYDROCHLOROTHIAZIDE 25 MG TAB
25 mg | ORAL_TABLET | Freq: Every day | ORAL | 1 refills | Status: DC
Start: 2016-10-08 — End: 2017-03-25

## 2016-10-08 MED ORDER — AMLODIPINE 5 MG TAB
5 mg | ORAL_TABLET | Freq: Every day | ORAL | 1 refills | Status: DC
Start: 2016-10-08 — End: 2017-03-25

## 2016-10-08 NOTE — Progress Notes (Signed)
Chief Complaint   Patient presents with   ??? Hypertension     6 week follow up     Patient states she is here for her 6 week follow up for HTN.

## 2016-10-08 NOTE — Progress Notes (Signed)
HPI:  Vanessa Mathis is a 41 y.o. year old female who returns to clinic today for follow up: Hypertension.    She states she is tolerating the hydrochlorothiazide and amlodipine well with no side effects.  Lower extremity swelling is resolved.  BP running: 110-132/80's. Exercise: walking.    Current Outpatient Prescriptions   Medication Sig Dispense Refill   ??? hydroCHLOROthiazide (HYDRODIURIL) 25 mg tablet Take 1 Tab by mouth daily. 30 Tab 1   ??? amLODIPine (NORVASC) 5 mg tablet Take 1 Tab by mouth daily. 30 Tab 2   ??? TAYTULLA 1 mg-20 mcg (24)/75 mg (4) cap      ??? cyanocobalamin (VITAMIN B-12) 1,000 mcg tablet Take 1,000 mcg by mouth daily.       Allergies   Allergen Reactions   ??? Codeine Nausea Only     Social History   Substance Use Topics   ??? Smoking status: Former Smoker     Quit date: 04/26/2000   ??? Smokeless tobacco: Never Used   ??? Alcohol use 2.4 oz/week     2 Glasses of wine, 2 Cans of beer per week         Review of Systems   Constitutional: Negative for malaise/fatigue.   Respiratory: Negative for shortness of breath.    Cardiovascular: Negative for chest pain and leg swelling.   Gastrointestinal: Negative for abdominal pain and heartburn.   Neurological: Negative for dizziness and headaches.       Physical Exam   Constitutional: She appears well-developed. No distress.   BP 126/84   Pulse 89   Temp 98.4 ??F (36.9 ??C) (Oral)    Resp 16   Ht 5' 6.2" (1.681 m)   Wt 223 lb 9.6 oz (101.4 kg)   LMP 09/27/2016 (Approximate)   SpO2 99%   BMI 35.87 kg/m2   Cardiovascular: Normal rate, regular rhythm, normal heart sounds and intact distal pulses.    No murmur heard.  Pulmonary/Chest: Effort normal and breath sounds normal. She has no wheezes.   Abdominal: Soft. Bowel sounds are normal. She exhibits no distension and no mass. There is no tenderness.   Musculoskeletal: She exhibits no edema.   Psychiatric: She has a normal mood and affect.   Vitals reviewed.        Assessment & Plan:    ICD-10-CM ICD-9-CM     1. Essential hypertension I10 401.9 amLODIPine (NORVASC) 5 mg tablet      METABOLIC PANEL, BASIC   Blood pressure well controlled with medications and work on lifestyle changes weight loss low-salt diet.   Orders Placed This Encounter   ??? METABOLIC PANEL, BASIC   ??? amLODIPine (NORVASC) 5 mg tablet   ??? hydroCHLOROthiazide (HYDRODIURIL) 25 mg tablet        Follow-up Disposition:  Return in about 6 months (around 04/09/2017) for follow up.   Advised her to call back or return to office if symptoms worsen/change/persist.  Discussed expected course/resolution/complications of diagnosis in detail with patient.    Medication risks/benefits/costs/interactions/alternatives discussed with patient.  She was given an after visit summary which includes diagnoses, current medications, & vitals.  She expressed understanding with the diagnosis and plan.

## 2016-10-12 LAB — METABOLIC PANEL, BASIC
BUN/Creatinine ratio: 13 (ref 9–23)
BUN: 10 mg/dL (ref 6–24)
CO2: 20 mmol/L (ref 20–29)
Calcium: 8.9 mg/dL (ref 8.7–10.2)
Chloride: 101 mmol/L (ref 96–106)
Creatinine: 0.77 mg/dL (ref 0.57–1.00)
GFR est AA: 111 mL/min/{1.73_m2} (ref >59)
GFR est non-AA: 96 mL/min/{1.73_m2} (ref >59)
Glucose: 84 mg/dL (ref 65–99)
Potassium: 3.9 mmol/L (ref 3.5–5.2)
Sodium: 138 mmol/L (ref 134–144)

## 2016-10-14 NOTE — Progress Notes (Signed)
Letter sent to patient.

## 2017-03-25 ENCOUNTER — Ambulatory Visit: Admit: 2017-03-25 | Discharge: 2017-03-25 | Payer: PRIVATE HEALTH INSURANCE | Attending: Internal Medicine

## 2017-03-25 DIAGNOSIS — I10 Essential (primary) hypertension: Secondary | ICD-10-CM

## 2017-03-25 MED ORDER — HYDROCHLOROTHIAZIDE 25 MG TAB
25 mg | ORAL_TABLET | Freq: Every day | ORAL | 1 refills | Status: DC
Start: 2017-03-25 — End: 2017-05-20

## 2017-03-25 MED ORDER — AMLODIPINE 10 MG TAB
10 mg | ORAL_TABLET | Freq: Every day | ORAL | 0 refills | Status: DC
Start: 2017-03-25 — End: 2017-05-20

## 2017-03-25 NOTE — Progress Notes (Signed)
Pt. Is here for follow up Htn. Dr Graciella BeltonWlibanks saw her yesterday and is also concerned about her BP.

## 2017-03-25 NOTE — Progress Notes (Signed)
HPI:  Vanessa Mathis is a 41 y.o. year old female who returns to clinic today for follow up:   Hypertension  Cardiovascular: She reports taking medications as instructed, no medication side effects noted, The home BP readings outside of office have been in the 130's / 80-90's range. .  Diet and Lifestyle: generally follows a low fat low cholesterol diet, generally follows a low sodium diet. Exercise: none but planning to get a pelaton.      Current Outpatient Medications   Medication Sig Dispense Refill   ??? amLODIPine (NORVASC) 5 mg tablet Take 1 Tab by mouth daily. 90 Tab 1   ??? hydroCHLOROthiazide (HYDRODIURIL) 25 mg tablet Take 1 Tab by mouth daily. 90 Tab 1   ??? norethindrone-ethinyl estradiol (JUNEL 1/20, 21,) 1-20 mg-mcg tab Take 1 Tab by mouth daily.       Allergies   Allergen Reactions   ??? Codeine Nausea Only     Social History     Tobacco Use   ??? Smoking status: Former Smoker     Last attempt to quit: 04/26/2000     Years since quitting: 16.9   ??? Smokeless tobacco: Never Used   Substance Use Topics   ??? Alcohol use: Yes     Alcohol/week: 2.4 oz     Types: 2 Glasses of wine, 2 Cans of beer per week         Review of Systems   Constitutional: Positive for malaise/fatigue.   Respiratory: Negative for shortness of breath.    Cardiovascular: Negative for chest pain and leg swelling.   Gastrointestinal: Negative for abdominal pain and heartburn.   Neurological: Negative for dizziness and headaches.     Physical Exam   Constitutional: She appears well-developed. No distress.   HENT:   Head: Normocephalic and atraumatic.   Neck: Carotid bruit is not present.   Cardiovascular: Normal rate, regular rhythm, normal heart sounds and intact distal pulses.   No murmur heard.  Pulmonary/Chest: Effort normal and breath sounds normal. She has no wheezes.   Abdominal: Soft. Bowel sounds are normal. She exhibits no distension and no mass. There is no tenderness.   Musculoskeletal: She exhibits no edema.    Psychiatric: She has a normal mood and affect.   Nursing note and vitals reviewed.    Visit Vitals  BP (!) 132/98   Pulse 80   Temp 99.1 ??F (37.3 ??C) (Oral)   Resp 16   Ht 5' 6.2" (1.681 m)   Wt 231 lb 6.4 oz (105 kg)   LMP 03/11/2017 (Exact Date)   SpO2 99%   BMI 37.12 kg/m??       Assessment & Plan:    ICD-10-CM ICD-9-CM    1. Essential hypertension I10 401.9 amLODIPine (NORVASC) 10 mg tablet      METABOLIC PANEL, BASIC   not at goal and will increase amlodipine to 10 mg every day. Counseled continue to work on the following lifestyle changes weight loss, low salt diet, and exercise. Check BP at home.   Counseled regarding elevated BMI and current weight goals.  Reviewed weight loss strategies including dietary changes and exercise.        Follow-up Disposition:  Return in about 4 weeks (around 04/22/2017).   Advised her to call back or return to office if symptoms worsen/change/persist.  Discussed expected course/resolution/complications of diagnosis in detail with patient.    Medication risks/benefits/costs/interactions/alternatives discussed with patient.  She was given an after visit summary which includes diagnoses, current medications, &  vitals.  She expressed understanding with the diagnosis and plan.

## 2017-03-29 LAB — METABOLIC PANEL, BASIC
BUN/Creatinine ratio: 13 (ref 9–23)
BUN: 11 mg/dL (ref 6–24)
CO2: 21 mmol/L (ref 20–29)
Calcium: 8.9 mg/dL (ref 8.7–10.2)
Chloride: 99 mmol/L (ref 96–106)
Creatinine: 0.85 mg/dL (ref 0.57–1.00)
GFR est AA: 98 mL/min/{1.73_m2} (ref >59)
GFR est non-AA: 85 mL/min/{1.73_m2} (ref >59)
Glucose: 84 mg/dL (ref 65–99)
Potassium: 4 mmol/L (ref 3.5–5.2)
Sodium: 137 mmol/L (ref 134–144)

## 2017-04-23 NOTE — Telephone Encounter (Signed)
Writer spoke with pharmacy, which stated pt isn't due for a refill at this time.

## 2017-04-23 NOTE — Telephone Encounter (Signed)
Pharm refill request

## 2017-04-24 ENCOUNTER — Ambulatory Visit: Admit: 2017-04-24 | Payer: PRIVATE HEALTH INSURANCE | Attending: Internal Medicine

## 2017-04-24 DIAGNOSIS — I10 Essential (primary) hypertension: Secondary | ICD-10-CM

## 2017-04-24 MED ORDER — LISINOPRIL 10 MG TAB
10 mg | ORAL_TABLET | Freq: Every day | ORAL | 0 refills | Status: DC
Start: 2017-04-24 — End: 2017-05-20

## 2017-04-24 NOTE — Progress Notes (Signed)
Vanessa Mathis is a 42 y.o. female      Chief Complaint   Patient presents with   ??? Follow-up   ??? Hypertension       1. Have you been to the ER, urgent care clinic since your last visit?  Hospitalized since your last visit?No    2. Have you seen or consulted any other health care providers outside of the [facility] since your last visit?  Include any pap smears or colon screening. No

## 2017-04-24 NOTE — Progress Notes (Signed)
HPI:  Vanessa Mathis is a 42 y.o. year old female who returns to clinic today for follow up: HTN    Cardiovascular: She reports taking medications as instructed, no medication side effects noted, The home BP readings outside of office have been in the 160's / 100's range. .  Diet and Lifestyle: generally follows a low fat low cholesterol diet, generally follows a low sodium diet.     Current Outpatient Medications   Medication Sig Dispense Refill   ??? norethindrone-ethinyl estradiol (JUNEL 1/20, 21,) 1-20 mg-mcg tab Take 1 Tab by mouth daily.     ??? amLODIPine (NORVASC) 10 mg tablet Take 1 Tab by mouth daily. 90 Tab 0   ??? hydroCHLOROthiazide (HYDRODIURIL) 25 mg tablet Take 1 Tab by mouth daily. 90 Tab 1     Allergies   Allergen Reactions   ??? Codeine Nausea Only     Social History     Tobacco Use   ??? Smoking status: Former Smoker     Last attempt to quit: 04/26/2000     Years since quitting: 17.0   ??? Smokeless tobacco: Never Used   Substance Use Topics   ??? Alcohol use: Yes     Alcohol/week: 2.4 oz     Types: 2 Glasses of wine, 2 Cans of beer per week         Review of Systems   Respiratory: Negative for shortness of breath.    Cardiovascular: Negative for chest pain and leg swelling.   Gastrointestinal: Negative for abdominal pain and heartburn.     Physical Exam   Constitutional: She appears well-developed. No distress.   HENT:   Head: Normocephalic and atraumatic.   Neck: Carotid bruit is not present.   Cardiovascular: Normal rate, regular rhythm, normal heart sounds and intact distal pulses.   No murmur heard.  Pulmonary/Chest: Effort normal and breath sounds normal. She has no wheezes.   Abdominal: Soft. Bowel sounds are normal. She exhibits no distension and no mass. There is no tenderness.   Musculoskeletal: She exhibits no edema.   Psychiatric: She has a normal mood and affect.   Nursing note and vitals reviewed.    Visit Vitals  BP (!) 152/100 (BP 1 Location: Left arm, BP Patient Position: Sitting)   Pulse (!) 102    Temp 98.8 ??F (37.1 ??C) (Oral)   Resp 16   Ht 5\' 6"  (1.676 m)   Wt 236 lb (107 kg)   LMP 04/06/2017   SpO2 99%   BMI 38.09 kg/m??       Assessment & Plan:    ICD-10-CM ICD-9-CM    1. Essential hypertension I10 401.9    2. Severe obesity (BMI 35.0-39.9) with comorbidity (HCC) E66.01 278.01    Not at goal.  We will add lisinopril 10 mg daily.  She will send me her blood pressures in 2 weeks for review. Discussed side effects of medication and will call us if any problems on medication.   Counseled regarding elevated BMI and current weight goals.  Reviewed weight loss strategies including dietary changes and exercise.     Orders Placed This Encounter   ??? lisinopril (PRINIVIL, ZESTRIL) 10 mg tablet        Follow-up Disposition:  Return in about 3 weeks (around 05/15/2017).   Advised her to call back or return to office if symptoms worsen/change/persist.  Discussed expected course/resolution/complications of diagnosis in detail with patient.    Medication risks/benefits/costs/interactions/alternatives discussed with patient.  She was given an after  visit summary which includes diagnoses, current medications, & vitals.  She expressed understanding with the diagnosis and plan.

## 2017-04-28 ENCOUNTER — Ambulatory Visit: Admit: 2017-04-28 | Payer: PRIVATE HEALTH INSURANCE | Attending: Internal Medicine

## 2017-04-28 DIAGNOSIS — R1084 Generalized abdominal pain: Secondary | ICD-10-CM

## 2017-04-28 NOTE — Progress Notes (Signed)
1. Have you been to the ER, urgent care clinic since your last visit?  Hospitalized since your last visit?No    2. Have you seen or consulted any other health care providers outside of the [facility] since your last visit?  Include any pap smears or colon screening. No     Chief Complaint   Patient presents with   ??? Abdominal Pain     lower abdominal swelling and pain, since Friday evening; started new medication Lisinopril on Friday morning; denies any urinary burning or frequency, but does state lower abdominal pressure.      Not fasting

## 2017-04-28 NOTE — Telephone Encounter (Signed)
Pt. States she started lisinopril fri. Morning, felt dizzy, by fri. Evening her abdomen became distended and tender, she is passing gas, had a normal stool, BP is 121/83, Pt. Has continued taking medication. Pt. Scheduled for an appt. Today with Dr Madilyn FiremanHayes 3:45p, if symptoms worsen prior to call back and speak with a nurse.

## 2017-04-28 NOTE — Telephone Encounter (Signed)
RX: lisinopril  Pt states she is having some side effects -  BP is down.  Advise what to do - [phone]

## 2017-04-28 NOTE — Progress Notes (Signed)
Vanessa Mathis is a 42 y.o. female who was seen in clinic today (04/28/2017) for an acute visit.        Assessment & Plan:   Diagnoses and all orders for this visit:    1. Generalized abdominal pain- this is a new problem, symptoms are: improved, differential dx reviewed with the patient, exact etiology is unclear at this time.   Tend to favor due to lisinopril (per [facility] abdominal pain is a side effects).  Will continue w/ current meds.  Will continue to monitor for now.  Red flags were reviewed with the patient to RTC or notify me, expected time course for resolution reviewed.       2. Essential hypertension- well controlled, will continue current treatment assuming #1 resolves.           Follow-up Disposition:  Return if symptoms worsen or fail to improve.      Subjective:   Vanessa Mathis was seen today for Abdominal Pain (lower abdominal swelling and pain, since Friday evening; started new medication Lisinopril on Friday morning; denies any urinary burning or frequency, but does state lower abdominal pressure. )    Abdominal Pain  Vanessa Mathis is here to talk about abdominal pain.  This is a new problem and symptoms began 3 days ago and has improved but not resolved.  Pain is generalized in location and described as pressure and bloating.  She also reports the following symptoms: nothing.  She denies constipation, diarrhea, nausea, or vomiting.  She symptoms are aggravated by standing, laying on her stomach, and pushing on her stomach.  No changes w/ eating, urinating, or having a BM.  She has tried none.  The only new change was starting lisinopril last week (the day these symptoms started).  She has changed around her diet slightly (less carbs, more protein).         Prior to Admission medications    Medication Sig Start Date End Date Taking? Authorizing Provider   lisinopril (PRINIVIL, ZESTRIL) 10 mg tablet Take 1 Tab by mouth daily. 04/24/17  Yes Johnsie Kindred, MD    norethindrone-ethinyl estradiol (JUNEL 1/20, 21,) 1-20 mg-mcg tab Take 1 Tab by mouth daily.   Yes Provider, Historical   amLODIPine (NORVASC) 10 mg tablet Take 1 Tab by mouth daily. 03/25/17  Yes Johnsie Kindred, MD   hydroCHLOROthiazide (HYDRODIURIL) 25 mg tablet Take 1 Tab by mouth daily. 03/25/17  Yes Johnsie Kindred, MD          Allergies   Allergen Reactions   ??? Codeine Nausea Only           ROS - per HPI        Objective:   Physical Exam   Cardiovascular: Regular rhythm and normal heart sounds.   No murmur heard.  Pulmonary/Chest: Effort normal and breath sounds normal. She has no wheezes. She has no rales.   Abdominal: Bowel sounds are normal. She exhibits no distension and no mass. There is no hepatosplenomegaly. There is tenderness in the right lower quadrant, periumbilical area, suprapubic area and left lower quadrant. There is guarding. There is no rigidity, no rebound and no CVA tenderness.         Visit Vitals  BP 130/88 (BP 1 Location: Right arm, BP Patient Position: Sitting)   Pulse 94   Temp 98.2 ??F (36.8 ??C) (Oral)   Resp 14   Ht 5\' 6"  (1.676 m)   Wt 233 lb (105.7 kg)  LMP 04/06/2017   SpO2 99%   BMI 37.61 kg/m??         Disclaimer:  Advised her to call back or return to office if symptoms worsen/change/persist.  Discussed expected course/resolution/complications of diagnosis in detail with patient.    Medication risks/benefits/costs/interactions/alternatives discussed with patient.  She was given an after visit summary which includes diagnoses, current medications, & vitals.  She expressed understanding with the diagnosis and plan.      Aspects of this note may have been generated using voice recognition software. Despite editing, there may be some syntax errors.       Bud Facehristopher P Syaire Saber, MD

## 2017-05-06 NOTE — Telephone Encounter (Signed)
Error

## 2017-05-08 NOTE — Telephone Encounter (Signed)
Notify patient I have received the blood pressures that she sent me over the past 10 days.  Her blood pressure does appear to be improving on medication.  Continue current medication and keep the follow-up appointment that she has scheduled.  Blood pressures ranging over the past 7 days 120-130/80's.

## 2017-05-09 NOTE — Telephone Encounter (Signed)
Patient notified per Dr. Cliffton AstersWhite, to continue taking BP medication as prescribed and to keep f/u appt on 05/20/17. She will continue to monitor & record her BP readings.

## 2017-05-16 ENCOUNTER — Encounter

## 2017-05-20 ENCOUNTER — Ambulatory Visit: Admit: 2017-05-20 | Discharge: 2017-05-20 | Payer: PRIVATE HEALTH INSURANCE | Attending: Internal Medicine

## 2017-05-20 DIAGNOSIS — I10 Essential (primary) hypertension: Secondary | ICD-10-CM

## 2017-05-20 MED ORDER — HYDROCHLOROTHIAZIDE 25 MG TAB
25 mg | ORAL_TABLET | Freq: Every day | ORAL | 1 refills | Status: DC
Start: 2017-05-20 — End: 2017-11-11

## 2017-05-20 MED ORDER — LISINOPRIL 10 MG TAB
10 mg | ORAL_TABLET | Freq: Every day | ORAL | 1 refills | Status: DC
Start: 2017-05-20 — End: 2017-10-14

## 2017-05-20 MED ORDER — AMLODIPINE 10 MG TAB
10 mg | ORAL_TABLET | Freq: Every day | ORAL | 1 refills | Status: DC
Start: 2017-05-20 — End: 2017-11-11

## 2017-05-20 NOTE — Progress Notes (Signed)
Chief Complaint   Patient presents with   ??? Hypertension     Patient states she is here for her 3 week follow up.

## 2017-05-20 NOTE — Progress Notes (Signed)
HPI:  Vanessa Mathis is a 42 y.o. year old female who returns to clinic today for follow up: Hypertension    Cardiovascular: has lost 5 pounds over past 3 weeks.  She reports taking medications as instructed, no medication side effects noted, The home BP readings outside of office have been in the 120's / 70-80's range. .  Diet and Lifestyle: generally follows a low fat low cholesterol diet, generally follows a low sodium diet. Exercise: spinning on her Pelaton.     Current Outpatient Medications   Medication Sig Dispense Refill   ??? lisinopril (PRINIVIL, ZESTRIL) 10 mg tablet Take 1 Tab by mouth daily. 30 Tab 0   ??? norethindrone-ethinyl estradiol (JUNEL 1/20, 21,) 1-20 mg-mcg tab Take 1 Tab by mouth daily.     ??? amLODIPine (NORVASC) 10 mg tablet Take 1 Tab by mouth daily. 90 Tab 0   ??? hydroCHLOROthiazide (HYDRODIURIL) 25 mg tablet Take 1 Tab by mouth daily. 90 Tab 1     Allergies   Allergen Reactions   ??? Codeine Nausea Only     Social History     Tobacco Use   ??? Smoking status: Former Smoker     Last attempt to quit: 04/26/2000     Years since quitting: 17.0   ??? Smokeless tobacco: Never Used   Substance Use Topics   ??? Alcohol use: Yes     Alcohol/week: 2.4 oz     Types: 2 Glasses of wine, 2 Cans of beer per week         Review of Systems   Constitutional: Negative for malaise/fatigue.   Respiratory: Negative for shortness of breath.    Cardiovascular: Negative for chest pain and leg swelling.   Gastrointestinal: Negative for abdominal pain and heartburn.   Neurological: Negative for dizziness and headaches.     Physical Exam  Visit Vitals  BP 128/86   Pulse 86   Temp 98.6 ??F (37 ??C) (Oral)   Resp 16   Ht 5\' 6"  (1.676 m)   Wt 231 lb 12.8 oz (105.1 kg)   LMP 05/06/2017 (Approximate)   SpO2 99%   BMI 37.41 kg/m??       Assessment & Plan:    ICD-10-CM ICD-9-CM    1. Essential hypertension  Much improved.  Continue with current medication and lifestyle changes. I10 401.9 amLODIPine (NORVASC) 10 mg tablet    2. Encounter for immunization Z23 V03.89 TETANUS, DIPHTHERIA TOXOIDS AND ACELLULAR PERTUSSIS VACCINE (TDAP), IN INDIVIDS. >=7, IM        Follow-up Disposition:  Return in about 3 months (around 08/18/2017) for follow up.   Advised her to call back or return to office if symptoms worsen/change/persist.  Discussed expected course/resolution/complications of diagnosis in detail with patient.    Medication risks/benefits/costs/interactions/alternatives discussed with patient.  She was given an after visit summary which includes diagnoses, current medications, & vitals.  She expressed understanding with the diagnosis and plan.

## 2017-05-23 NOTE — Telephone Encounter (Signed)
Refill sent 05/20/17

## 2017-06-12 ENCOUNTER — Ambulatory Visit: Admit: 2017-06-12 | Discharge: 2017-06-12 | Payer: PRIVATE HEALTH INSURANCE | Attending: Internal Medicine

## 2017-06-12 DIAGNOSIS — J029 Acute pharyngitis, unspecified: Secondary | ICD-10-CM

## 2017-06-12 LAB — AMB POC RAPID STREP A: Group A Strep Ag: NEGATIVE

## 2017-06-12 NOTE — Progress Notes (Signed)
HISTORY OF PRESENT ILLNESS  Vanessa Mathis is a 42 y.o. female.  Sore Throat    The history is provided by the patient. This is a new problem. Episode onset: 3 d. The problem has not changed since onset.There has been no fever. Associated symptoms include congestion, ear pain and headaches. Pertinent negatives include no diarrhea, no vomiting and no cough. She has had no exposure to strep or mono. She has tried NSAIDs for the symptoms. The treatment provided mild relief.       Review of Systems   HENT: Positive for congestion, ear pain and sore throat.    Respiratory: Negative for cough.    Gastrointestinal: Negative for diarrhea and vomiting.   Neurological: Positive for headaches.       Physical Exam   Constitutional: No distress.   HENT:   Right Ear: Tympanic membrane and ear canal normal.   Left Ear: Tympanic membrane and ear canal normal.   Nose: Right sinus exhibits maxillary sinus tenderness. Right sinus exhibits no frontal sinus tenderness. Left sinus exhibits maxillary sinus tenderness. Left sinus exhibits no frontal sinus tenderness.       Mouth/Throat: Posterior oropharyngeal erythema present. No oropharyngeal exudate, posterior oropharyngeal edema or tonsillar abscesses.       Eyes: Conjunctivae are normal. Right eye exhibits no discharge. Left eye exhibits no discharge.   Neck: Neck supple.   Cardiovascular: Normal rate and regular rhythm. Exam reveals no gallop and no friction rub.   No murmur heard.  Pulmonary/Chest: Effort normal and breath sounds normal.   Lymphadenopathy:     She has no cervical adenopathy.   Nursing note and vitals reviewed.      ASSESSMENT and PLAN  Diagnoses and all orders for this visit:    1. Sore throat - likely viral  -     AMB POC RAPID STREP A-  neg  -     UPPER RESPIRATORY CULTURE  - Dayquil severe and Nyquil nighttime  as directed . Ok for short use

## 2017-06-12 NOTE — Patient Instructions (Addendum)
Sore Throat: Care Instructions  Your Care Instructions    Infection by bacteria or a virus causes most sore throats. Cigarette smoke, dry air, air pollution, allergies, and yelling can also cause a sore throat. Sore throats can be painful and annoying. Fortunately, most sore throats go away on their own. If you have a bacterial infection, your doctor may prescribe antibiotics.  Follow-up care is a key part of your treatment and safety. Be sure to make and go to all appointments, and call your doctor if you are having problems. It's also a good idea to know your test results and keep a list of the medicines you take.  How can you care for yourself at home?  ?? If your doctor prescribed antibiotics, take them as directed. Do not stop taking them just because you feel better. You need to take the full course of antibiotics.  ?? Gargle with warm salt water once an hour to help reduce swelling and relieve discomfort. Use 1 teaspoon of salt mixed in 1 cup of warm water.  ?? Take an over-the-counter pain medicine, such as acetaminophen (Tylenol), ibuprofen (Advil, Motrin), or naproxen (Aleve). Read and follow all instructions on the label.  ?? Be careful when taking over-the-counter cold or flu medicines and Tylenol at the same time. Many of these medicines have acetaminophen, which is Tylenol. Read the labels to make sure that you are not taking more than the recommended dose. Too much acetaminophen (Tylenol) can be harmful.  ?? Drink plenty of fluids. Fluids may help soothe an irritated throat. Hot fluids, such as tea or soup, may help decrease throat pain.  ?? Use over-the-counter throat lozenges to soothe pain. Regular cough drops or hard candy may also help. These should not be given to young children because of the risk of choking.  ?? Do not smoke or allow others to smoke around you. If you need help quitting, talk to your doctor about stop-smoking programs and medicines.  These can increase your chances of quitting for good.  ?? Use a vaporizer or humidifier to add moisture to your bedroom. Follow the directions for cleaning the machine.  When should you call for help?  Call your doctor now or seek immediate medical care if:  ?? ?? You have new or worse trouble swallowing.   ?? ?? Your sore throat gets much worse on one side.   ??Watch closely for changes in your health, and be sure to contact your doctor if you do not get better as expected.  Where can you learn more?  Go to http://www.healthwise.net/[employer].  Enter U420 in the search box to learn more about "Sore Throat: Care Instructions."  Current as of: July 16, 2016  Content Version: 11.9  ?? 2006-2018 [employer]. Care instructions adapted under license by [employer] (which disclaims liability or warranty for this information). If you have questions about a medical condition or this instruction, always ask your healthcare professional. [employer] disclaims any warranty or liability for your use of this information.

## 2017-06-14 LAB — UPPER RESPIRATORY CULTURE

## 2017-06-24 ENCOUNTER — Ambulatory Visit: Admit: 2017-06-24 | Discharge: 2017-06-24 | Payer: PRIVATE HEALTH INSURANCE | Attending: Internal Medicine

## 2017-06-24 DIAGNOSIS — B9689 Other specified bacterial agents as the cause of diseases classified elsewhere: Secondary | ICD-10-CM

## 2017-06-24 MED ORDER — CEFUROXIME AXETIL 250 MG TAB
250 mg | ORAL_TABLET | Freq: Two times a day (BID) | ORAL | 0 refills | Status: AC
Start: 2017-06-24 — End: 2017-07-04

## 2017-06-24 NOTE — Progress Notes (Signed)
HISTORY OF PRESENT ILLNESS  Vanessa Mathis is a 42 y.o. female.  URI    The history is provided by the patient (seen 2 wks ago for viral pharyngitis. Sx resolved , then several d later had relapse). This is a new problem. Episode onset: 2 wks + There has been no fever. Associated symptoms include chest pain, congestion, ear pain, sore throat, cough and wheezing. Pertinent negatives include no abdominal pain, no diarrhea, no nausea and no vomiting. Treatments tried: day, nyquil  The treatment provided mild relief.       Review of Systems   HENT: Positive for congestion, ear pain and sore throat.    Respiratory: Positive for cough and wheezing.    Cardiovascular: Positive for chest pain.   Gastrointestinal: Negative for abdominal pain, diarrhea, nausea and vomiting.       Physical Exam   Constitutional: No distress.   HENT:   Right Ear: Tympanic membrane and ear canal normal.   Left Ear: Tympanic membrane is not perforated and not erythematous. A middle ear effusion is present.   Mouth/Throat: Posterior oropharyngeal edema and posterior oropharyngeal erythema present. No oropharyngeal exudate or tonsillar abscesses.   Eyes: Conjunctivae are normal. Right eye exhibits no discharge. Left eye exhibits no discharge.   Neck: Neck supple.   Cardiovascular: Normal rate and regular rhythm. Exam reveals no gallop and no friction rub.   No murmur heard.  Pulmonary/Chest: Effort normal and breath sounds normal.   Lymphadenopathy:     She has no cervical adenopathy.   Nursing note and vitals reviewed.      ASSESSMENT and PLAN  Diagnoses and all orders for this visit:    1. Bacterial sinusitis  -     cefUROXime (CEFTIN) 250 mg tablet; Take 1 Tab by mouth two (2) times a day for 10 days.  - Continue other medications in addition to current changes . Declines Rx cough med

## 2017-06-24 NOTE — Patient Instructions (Addendum)
Sinusitis: Care Instructions  Your Care Instructions    Sinusitis is an infection of the lining of the sinus cavities in your head. Sinusitis often follows a cold. It causes pain and pressure in your head and face.  In most cases, sinusitis gets better on its own in 1 to 2 weeks. But some mild symptoms may last for several weeks. Sometimes antibiotics are needed.  Follow-up care is a key part of your treatment and safety. Be sure to make and go to all appointments, and call your doctor if you are having problems. It's also a good idea to know your test results and keep a list of the medicines you take.  How can you care for yourself at home?  ?? Take an over-the-counter pain medicine, such as acetaminophen (Tylenol), ibuprofen (Advil, Motrin), or naproxen (Aleve). Read and follow all instructions on the label.  ?? If the doctor prescribed antibiotics, take them as directed. Do not stop taking them just because you feel better. You need to take the full course of antibiotics.  ?? Be careful when taking over-the-counter cold or flu medicines and Tylenol at the same time. Many of these medicines have acetaminophen, which is Tylenol. Read the labels to make sure that you are not taking more than the recommended dose. Too much acetaminophen (Tylenol) can be harmful.  ?? Breathe warm, moist air from a steamy shower, a hot bath, or a sink filled with hot water. Avoid cold, dry air. Using a humidifier in your home may help. Follow the directions for cleaning the machine.  ?? Use saline (saltwater) nasal washes to help keep your nasal passages open and wash out mucus and bacteria. You can buy saline nose drops at a grocery store or drugstore. Or you can make your own at home by adding 1 teaspoon of salt and 1 teaspoon of baking soda to 2 cups of distilled water. If you make your own, fill a bulb syringe with the solution, insert the tip into your nostril, and squeeze gently. Blow your nose.   ?? Put a hot, wet towel or a warm gel pack on your face 3 or 4 times a day for 5 to 10 minutes each time.  ?? Try a decongestant nasal spray like oxymetazoline (Afrin). Do not use it for more than 3 days in a row. Using it for more than 3 days can make your congestion worse.  When should you call for help?  Call your doctor now or seek immediate medical care if:  ?? ?? You have new or worse swelling or redness in your face or around your eyes.   ?? ?? You have a new or higher fever.   ??Watch closely for changes in your health, and be sure to contact your doctor if:  ?? ?? You have new or worse facial pain.   ?? ?? The mucus from your nose becomes thicker (like pus) or has new blood in it.   ?? ?? You are not getting better as expected.   Where can you learn more?  Go to http://www.healthwise.net/[facility].  Enter I933 in the search box to learn more about "Sinusitis: Care Instructions."  Current as of: July 16, 2016  Content Version: 11.9  ?? 2006-2018 [employer]. Care instructions adapted under license by [employer] (which disclaims liability or warranty for this information). If you have questions about a medical condition or this instruction, always ask your healthcare professional. [employer] disclaims any warranty or liability for your   use of this information.

## 2017-06-24 NOTE — Progress Notes (Signed)
Chief Complaint   Patient presents with   ??? Other     patient states her chest "hurts to breath" and has a "crackling noise" when she lays down   ??? Sore Throat     left tonsil   ??? Ear Pain   ??? Cough     Visit Vitals  BP 120/86 (BP 1 Location: Right arm, BP Patient Position: Sitting)   Pulse 94   Temp 98.5 ??F (36.9 ??C) (Oral)   Resp 18   Ht 5\' 6"  (1.676 m)   Wt 235 lb (106.6 kg)   SpO2 99%   BMI 37.93 kg/m??

## 2017-06-27 NOTE — Telephone Encounter (Signed)
Patient states she is on day 4 of 10, of CEFTIN prescribed for sinus infection by Dr. Dondra SprySchutrumpf, denies fever or SOB, states she is congested and still "not feeling well", advised to follow up next week if not improving or seek urgent care if getting worse. She verbalized understanding.

## 2017-06-27 NOTE — Telephone Encounter (Signed)
Vanessa Mathis, Vanessa Mathis (Self) [phone] (M)     Pt says that she saw dr Dondra Spryschutrumpf on tues for uri and was given an antibiotic.  She says that she is still not feeling any better and is not sure what she should do.

## 2017-08-19 ENCOUNTER — Ambulatory Visit: Admit: 2017-08-19 | Payer: PRIVATE HEALTH INSURANCE | Attending: Internal Medicine

## 2017-08-19 DIAGNOSIS — I10 Essential (primary) hypertension: Secondary | ICD-10-CM

## 2017-08-19 NOTE — Progress Notes (Signed)
My chart message sent that lab work fine except some elevated cholesterol. Lifestyle changes recommended.

## 2017-08-19 NOTE — Progress Notes (Signed)
HPI:  Vanessa Mathis is a 42 y.o. year old female who returns to clinic today for follow up: HTN and hypercholesterolemia     Cardiovascular: She reports taking medications as instructed, no medication side effects noted, The home BP readings outside of office have been in the 110-120's / 70-80's range. .  Diet and Lifestyle: generally follows a low fat low cholesterol diet, generally follows a low sodium diet. Exercise: biking on [place] and walking.   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 No suicidal thoughts. Has hx of depression distantly. Getting 6-7 hours of sleep a night.   Current Outpatient Medications   Medication Sig Dispense Refill   ??? ergocalciferol (ERGOCALCIFEROL) 50,000 unit capsule Take 50,000 Units by mouth.     ??? amLODIPine (NORVASC) 10 mg tablet Take 1 Tab by mouth daily. 90 Tab 1   ??? hydroCHLOROthiazide (HYDRODIURIL) 25 mg tablet Take 1 Tab by mouth daily. 90 Tab 1   ??? lisinopril (PRINIVIL, ZESTRIL) 10 mg tablet Take 1 Tab by mouth daily. 90 Tab 1   ??? norethindrone-ethinyl estradiol (JUNEL 1/20, 21,) 1-20 mg-mcg tab Take 1 Tab by mouth daily.       Allergies   Allergen Reactions   ??? Codeine Nausea Only     Social History     Tobacco Use   ??? Smoking status: Former Smoker     Last attempt to quit: 04/26/2000     Years since quitting: 17.3   ??? Smokeless tobacco: Never Used   Substance Use Topics   ??? Alcohol use: Yes     Alcohol/week: 2.4 oz     Types: 2 Glasses of wine, 2 Cans of beer per week         Review of Systems   Respiratory: Negative for shortness of breath.    Cardiovascular: Positive for leg swelling (ankles at end of day). Negative for chest pain.   Gastrointestinal: Negative for abdominal pain and heartburn.   Neurological: Negative for dizziness and headaches.     Physical Exam   Constitutional: She appears well-developed. No distress.   HENT:   Head: Normocephalic and atraumatic.   Neck: Carotid bruit is not present.    Cardiovascular: Normal rate, regular rhythm, normal heart sounds and intact distal pulses.   No murmur heard.  Pulmonary/Chest: Effort normal and breath sounds normal. She has no wheezes.   Abdominal: Soft. Bowel sounds are normal. She exhibits no distension and no mass. There is no tenderness.   Musculoskeletal: She exhibits edema (trace ankle edema).   Psychiatric: She has a normal mood and affect.   Nursing note and vitals reviewed.    Visit Vitals  BP 114/80 (BP 1 Location: Left arm, BP Patient Position: Sitting)   Pulse 85   Temp 98.3 ??F (36.8 ??C) (Oral)   Resp 18   Ht  (1.676 m)   Wt 235 lb 9.6 oz (106.9 kg)   LMP 07/29/2017   SpO2 98%   BMI 38.03 kg/m??       Assessment & Plan:    ICD-10-CM ICD-9-CM    1. Essential hypertension  Well controlled, continue current medication. Labs ordered.  I10 401.9    2. Hypercholesterolemia  Lifestyle management E78.00 272.0 LIPID PANEL      METABOLIC PANEL, COMPREHENSIVE   3. Severe obesity (BMI 35.0-39.9) with comorbidity (HCC)  Counseled regarding elevated BMI and current weight goals.  Reviewed weight loss strategies including dietary changes and exercise.    E66.01  278.01    4. Mild depression (HCC)  Does not want medication. She is working with Veterinary surgeon. Contracts if worsening or SI will contact me or go ER.  F32.0 311    5. Fatigue, unspecified type R53.83 780.79 TSH 3RD GENERATION      T4, FREE      CBC WITH AUTOMATED DIFF      Orders Placed This Encounter   ??? LIPID PANEL   ??? METABOLIC PANEL, COMPREHENSIVE   ??? TSH 3RD GENERATION   ??? T4, FREE   ??? CBC WITH AUTOMATED DIFF   follow up 6 months.       Advised her to call back or return to office if symptoms worsen/change/persist.  Discussed expected course/resolution/complications of diagnosis in detail with patient.    Medication risks/benefits/costs/interactions/alternatives discussed with patient.  She was given an after visit summary which includes diagnoses, current medications, & vitals.   She expressed understanding with the diagnosis and plan.

## 2017-08-19 NOTE — Progress Notes (Signed)
Identified pt with two pt identifiers(name and DOB). Reviewed record in preparation for visit and have obtained necessary documentation. All patient medications has been reviewed.  Chief Complaint   Patient presents with   ??? Follow-up     blood pressure        There are no preventive care reminders to display for this patient.    Vitals:    08/19/17 1607   BP: 114/80   Pulse: 85   Resp: 18   Temp: 98.3 ??F (36.8 ??C)   TempSrc: Oral   SpO2: 98%   Weight: 235 lb 9.6 oz (106.9 kg)   Height:  (1.676 m)   PainSc:   0 - No pain   LMP: 07/29/2017       Coordination of Care Questionnaire:  :   1) Have you been to an emergency room, urgent care, or hospitalized since your last visit?   no       2. Have seen or consulted any other health care provider since your last visit? Yes has been following up with [facility] with Dr. Caren Hazy     3) Do you have an Advanced Directive/ Living Will in place? YES  If yes, do we have a copy on file NO  If no, would you like information NO    Patient is accompanied by self I have received verbal consent from Vanessa Mathis to discuss any/all medical information while they are present in the room.

## 2017-08-22 LAB — METABOLIC PANEL, COMPREHENSIVE
A-G Ratio: 1.5 (ref 1.2–2.2)
ALT (SGPT): 11 IU/L (ref 0–32)
AST (SGOT): 13 IU/L (ref 0–40)
Albumin: 4 g/dL (ref 3.5–5.5)
Alk. phosphatase: 62 IU/L (ref 39–117)
BUN/Creatinine ratio: 14 (ref 9–23)
BUN: 10 mg/dL (ref 6–24)
Bilirubin, total: <0.2 mg/dL (ref 0.0–1.2)
CO2: 23 mmol/L (ref 20–29)
Calcium: 9.1 mg/dL (ref 8.7–10.2)
Chloride: 101 mmol/L (ref 96–106)
Creatinine: 0.72 mg/dL (ref 0.57–1.00)
GFR est AA: 119 mL/min/{1.73_m2} (ref >59)
GFR est non-AA: 104 mL/min/{1.73_m2} (ref >59)
GLOBULIN, TOTAL: 2.7 g/dL (ref 1.5–4.5)
Glucose: 89 mg/dL (ref 65–99)
Potassium: 4.4 mmol/L (ref 3.5–5.2)
Protein, total: 6.7 g/dL (ref 6.0–8.5)
Sodium: 139 mmol/L (ref 134–144)

## 2017-08-22 LAB — LIPID PANEL
Cholesterol, total: 245 mg/dL — ABNORMAL HIGH (ref 100–199)
HDL Cholesterol: 57 mg/dL (ref >39)
LDL, calculated: 161 mg/dL — ABNORMAL HIGH (ref 0–99)
Triglyceride: 133 mg/dL (ref 0–149)
VLDL, calculated: 27 mg/dL (ref 5–40)

## 2017-08-22 LAB — CBC WITH AUTOMATED DIFF
ABS. BASOPHILS: 0 10*3/uL (ref 0.0–0.2)
ABS. EOSINOPHILS: 0.1 10*3/uL (ref 0.0–0.4)
ABS. IMM. GRANS.: 0 10*3/uL (ref 0.0–0.1)
ABS. MONOCYTES: 0.4 10*3/uL (ref 0.1–0.9)
ABS. NEUTROPHILS: 4.7 10*3/uL (ref 1.4–7.0)
Abs Lymphocytes: 2.1 10*3/uL (ref 0.7–3.1)
BASOPHILS: 0 %
EOSINOPHILS: 2 %
HCT: 39.6 % (ref 34.0–46.6)
HGB: 12.8 g/dL (ref 11.1–15.9)
IMMATURE GRANULOCYTES: 0 %
Lymphocytes: 29 %
MCH: 29 pg (ref 26.6–33.0)
MCHC: 32.3 g/dL (ref 31.5–35.7)
MCV: 90 fL (ref 79–97)
MONOCYTES: 6 %
NEUTROPHILS: 63 %
PLATELET: 302 10*3/uL (ref 150–379)
RBC: 4.41 x10E6/uL (ref 3.77–5.28)
RDW: 14.2 % (ref 12.3–15.4)
WBC: 7.4 10*3/uL (ref 3.4–10.8)

## 2017-08-22 LAB — T4, FREE: T4, Free: 1.19 ng/dL (ref 0.82–1.77)

## 2017-08-22 LAB — TSH 3RD GENERATION: TSH: 3.08 u[IU]/mL (ref 0.450–4.500)

## 2017-10-14 ENCOUNTER — Ambulatory Visit: Admit: 2017-10-14 | Payer: PRIVATE HEALTH INSURANCE | Attending: Internal Medicine

## 2017-10-14 ENCOUNTER — Ambulatory Visit: Attending: Internal Medicine

## 2017-10-14 DIAGNOSIS — T7840XA Allergy, unspecified, initial encounter: Secondary | ICD-10-CM

## 2017-10-14 MED ORDER — EPINEPHRINE 0.3 MG/0.3 ML IM PEN INJECTOR
0.3 mg/ mL | INJECTION | Freq: Once | INTRAMUSCULAR | 0 refills | Status: DC | PRN
Start: 2017-10-14 — End: 2017-10-15

## 2017-10-14 MED ORDER — PREDNISONE 10 MG TABLETS IN A DOSE PACK
10 mg | ORAL_TABLET | ORAL | 0 refills | Status: DC
Start: 2017-10-14 — End: 2017-11-11

## 2017-10-14 NOTE — Progress Notes (Signed)
Progress Notes by Johnsie KindredWhite, Jackee Glasner E, MD at 10/14/17 1600                Author: Johnsie KindredWhite, Guiseppe Flanagan E, MD  Service: --  Author Type: Physician       Filed: 10/14/17 1834  Encounter Date: 10/14/2017  Status: Signed          Editor: Johnsie KindredWhite, Luann Aspinwall E, MD (Physician)               HPI:   Vanessa BombardKendall is a 42 y.o. year old  female who returns to clinic today for an acute visit:    She reports lip swelling since yesterday and thought she had bit the side of her lip initially when she first felt the swelling and when woke up in the morning with swelling in upper and lower lip. Lower lip much improved and almost back to normal and  upper lip swelling worsened throughout the day. She did a telemedicine phone call and was told to take oral steroid and see primary care doctor.  She does not note any new medications or lotions detergents creams.  However she did eat peanuts yesterday  which is something that she does not usually eat.  She notes her father had a sesame seed allergy.  She has had some seasonal allergies    She has taken benadryl twice today and feels a little groggy from the Benadryl and tele doctor ordered a steroid pak. Notes some sinus congestion last week and has been taking zyrtec nightly.    BP at home 110/70's.   She denies any shortness of breath, tongue swelling, wheezing.   She has been taking her lisinopril for more than 6 months.     Current Outpatient Medications          Medication  Sig  Dispense  Refill           ?  amLODIPine (NORVASC) 10 mg tablet  Take 1 Tab by mouth daily.  90 Tab  1     ?  hydroCHLOROthiazide (HYDRODIURIL) 25 mg tablet  Take 1 Tab by mouth daily.  90 Tab  1     ?  lisinopril (PRINIVIL, ZESTRIL) 10 mg tablet  Take 1 Tab by mouth daily.  90 Tab  1     ?  norethindrone-ethinyl estradiol (JUNEL 1/20, 21,) 1-20 mg-mcg tab  Take 1 Tab by mouth daily.               ?  ergocalciferol (ERGOCALCIFEROL) 50,000 unit capsule  Take 50,000 Units by mouth.              Allergies        Allergen   Reactions         ?  Codeine  Nausea Only          Social History          Tobacco Use         ?  Smoking status:  Former Smoker              Last attempt to quit:  04/26/2000         Years since quitting:  17.4         ?  Smokeless tobacco:  Never Used       Substance Use Topics         ?  Alcohol use:  Yes              Alcohol/week:  2.4 oz              Types:  2 Glasses of wine, 2 Cans of beer per week             Review of Systems    Constitutional: Negative for chills, fever and malaise/fatigue.    HENT: Negative for congestion and sore throat.     Respiratory: Negative for cough, shortness of breath, wheezing and stridor.     Cardiovascular: Negative for chest pain and leg swelling.    Gastrointestinal: Negative for abdominal pain.    Musculoskeletal: Negative for joint pain and myalgias.    Neurological: Negative for dizziness and headaches.             Physical Exam    Constitutional: She appears well-developed. No distress.    HENT:    Nose: Nose normal.    Mouth/Throat: Oropharynx is clear and moist.    Eyes: Pupils are equal, round, and reactive to light. Conjunctivae and EOM are normal.    Neck: No thyromegaly present.    Cardiovascular: Normal rate, regular rhythm and intact distal pulses.    Pulmonary/Chest: Effort normal and breath sounds normal. She has no wheezes.    Abdominal: Soft. Bowel sounds are normal. She exhibits no distension and no mass. There is no tenderness.   Musculoskeletal: She exhibits no tenderness.   No swelling of extremities.     Lymphadenopathy:     She has no cervical adenopathy.   Neurological: She is alert.   Psychiatric: She has a normal mood and affect.     Nursing note and vitals reviewed.              Swelling of upper lip.  Tongue without swelling and normal appearance.      Assessment & Plan:             ICD-10-CM  ICD-9-CM             1.  Allergic reaction, initial encounter   Etiology unclear but will stop her lisinopril as this has been associated with lip swelling.   May also be allergic reaction to something environmental or the peanuts that she ate yesterday.  Counseled not to eat peanuts.   She is given an EpiPen as she is getting ready to travel to [place].  Counseled if she develops episode of facial swelling shortness of breath or tongue swelling should use the EpiPen and go to the ER for further evaluation.  She states her understanding  of red flags.   We will start prednisone taper over the next 10 days.  Zyrtec daily.  Benadryl as needed.  Counseled regarding side effects.  T78.40XA  995.3  REFERRAL TO ALLERGY           2.  Lip swelling   Treatment as above.  R22.0  784.2  REFERRAL TO ALLERGY           3.  Essential hypertension   Stop lisinopril as above.   Check blood pressures over the next 4 weeks.  And bring to follow-up.  I10  401.9            Orders Placed This Encounter        ?  REFERRAL TO ALLERGY     ?  predniSONE (STERAPRED DS) 10 mg dose pack        ?  EPINEPHrine (EPIPEN) 0.3 mg/0.3 mL injection  Advised her to call back or return to office if symptoms worsen/change/persist.   Discussed expected course/resolution/complications of diagnosis in detail with patient.      Medication risks/benefits/costs/interactions/alternatives discussed with patient.   She was given an after visit summary which includes diagnoses, current medications, & vitals.   She expressed understanding with the diagnosis and plan.

## 2017-10-14 NOTE — Progress Notes (Signed)
HPI:  Vanessa Mathis is a 42 y.o. year old female who returns to clinic today for an acute visit:   She reports lip swelling since yesterday and thought she had bit the side of her lip initially when she first felt the swelling and when woke up in the morning with swelling in upper and lower lip. Lower lip much improved and almost back to normal and upper lip swelling worsened throughout the day. She did a telemedicine phone call and was told to take oral steroid and see primary care doctor.  She does not note any new medications or lotions detergents creams.  However she did eat peanuts yesterday which is something that she does not usually eat.  She notes her father had a sesame seed allergy.  She has had some seasonal allergies   She has taken benadryl twice today and feels a little groggy from the Benadryl and tele doctor ordered a steroid pak. Notes some sinus congestion last week and has been taking zyrtec nightly.   BP at home 110/70's.  She denies any shortness of breath, tongue swelling, wheezing.  She has been taking her lisinopril for more than 6 months.  Current Outpatient Medications   Medication Sig Dispense Refill   ??? amLODIPine (NORVASC) 10 mg tablet Take 1 Tab by mouth daily. 90 Tab 1   ??? hydroCHLOROthiazide (HYDRODIURIL) 25 mg tablet Take 1 Tab by mouth daily. 90 Tab 1   ??? lisinopril (PRINIVIL, ZESTRIL) 10 mg tablet Take 1 Tab by mouth daily. 90 Tab 1   ??? norethindrone-ethinyl estradiol (JUNEL 1/20, 21,) 1-20 mg-mcg tab Take 1 Tab by mouth daily.     ??? ergocalciferol (ERGOCALCIFEROL) 50,000 unit capsule Take 50,000 Units by mouth.       Allergies   Allergen Reactions   ??? Codeine Nausea Only     Social History     Tobacco Use   ??? Smoking status: Former Smoker     Last attempt to quit: 04/26/2000     Years since quitting: 17.4   ??? Smokeless tobacco: Never Used   Substance Use Topics   ??? Alcohol use: Yes     Alcohol/week: 2.4 oz     Types: 2 Glasses of wine, 2 Cans of beer per week         Review of Systems    Constitutional: Negative for chills, fever and malaise/fatigue.   HENT: Negative for congestion and sore throat.    Respiratory: Negative for cough, shortness of breath, wheezing and stridor.    Cardiovascular: Negative for chest pain and leg swelling.   Gastrointestinal: Negative for abdominal pain.   Musculoskeletal: Negative for joint pain and myalgias.   Neurological: Negative for dizziness and headaches.         Physical Exam   Constitutional: She appears well-developed. No distress.   HENT:   Nose: Nose normal.   Mouth/Throat: Oropharynx is clear and moist.   Eyes: Pupils are equal, round, and reactive to light. Conjunctivae and EOM are normal.   Neck: No thyromegaly present.   Cardiovascular: Normal rate, regular rhythm and intact distal pulses.   Pulmonary/Chest: Effort normal and breath sounds normal. She has no wheezes.   Abdominal: Soft. Bowel sounds are normal. She exhibits no distension and no mass. There is no tenderness.   Musculoskeletal: She exhibits no tenderness.   No swelling of extremities.   Lymphadenopathy:     She has no cervical adenopathy.   Neurological: She is alert.   Psychiatric: She  has a normal mood and affect.   Nursing note and vitals reviewed.        Swelling of upper lip.  Tongue without swelling and normal appearance.    Assessment & Plan:    ICD-10-CM ICD-9-CM    1. Allergic reaction, initial encounter  Etiology unclear but will stop her lisinopril as this has been associated with lip swelling.  May also be allergic reaction to something environmental or the peanuts that she ate yesterday.  Counseled not to eat peanuts.  She is given an EpiPen as she is getting ready to travel to [place].  Counseled if she develops episode of facial swelling shortness of breath or tongue swelling should use the EpiPen and go to the ER for further evaluation.  She states her understanding of red flags.  We will start prednisone taper over the next 10 days.  Zyrtec daily.   Benadryl as needed.  Counseled regarding side effects. T78.40XA 995.3 REFERRAL TO ALLERGY   2. Lip swelling  Treatment as above. R22.0 784.2 REFERRAL TO ALLERGY   3. Essential hypertension  Stop lisinopril as above.  Check blood pressures over the next 4 weeks.  And bring to follow-up. I10 401.9      Orders Placed This Encounter   ??? REFERRAL TO ALLERGY   ??? predniSONE (STERAPRED DS) 10 mg dose pack   ??? EPINEPHrine (EPIPEN) 0.3 mg/0.3 mL injection           Advised her to call back or return to office if symptoms worsen/change/persist.  Discussed expected course/resolution/complications of diagnosis in detail with patient.    Medication risks/benefits/costs/interactions/alternatives discussed with patient.  She was given an after visit summary which includes diagnoses, current medications, & vitals.  She expressed understanding with the diagnosis and plan.

## 2017-10-15 MED ORDER — EPINEPHRINE 0.3 MG/0.3 ML IM PEN INJECTOR
0.3 mg/ mL | INJECTION | Freq: Once | INTRAMUSCULAR | 0 refills | Status: AC | PRN
Start: 2017-10-15 — End: 2017-10-15

## 2017-10-15 NOTE — Telephone Encounter (Signed)
Per verbal order Dr. Kelley White.

## 2017-11-11 ENCOUNTER — Ambulatory Visit: Admit: 2017-11-11 | Discharge: 2017-11-12 | Payer: PRIVATE HEALTH INSURANCE | Attending: Internal Medicine

## 2017-11-11 ENCOUNTER — Ambulatory Visit: Attending: Internal Medicine

## 2017-11-11 DIAGNOSIS — I10 Essential (primary) hypertension: Secondary | ICD-10-CM

## 2017-11-11 MED ORDER — AMLODIPINE 10 MG TAB
10 mg | ORAL_TABLET | Freq: Every day | ORAL | 1 refills | Status: DC
Start: 2017-11-11 — End: 2018-05-08

## 2017-11-11 MED ORDER — HYDROCHLOROTHIAZIDE 25 MG TAB
25 mg | ORAL_TABLET | Freq: Every day | ORAL | 1 refills | Status: DC
Start: 2017-11-11 — End: 2018-05-08

## 2017-11-11 NOTE — Progress Notes (Signed)
Patient here today for 1 month follow up for hypertension & possible allergic reaction to lisinopril. Has not scheduled an appt to see an allergist.

## 2017-11-11 NOTE — Progress Notes (Signed)
HPI:  Vanessa Mathis is a 42 y.o. year old female who returns to clinic today for an acute visit:   HTN: taking amlodipine and HCTZ. 120-130/80-90's off lisinopril   Lip swelling resolved and no further recurrence and plans to hold on allergist eval due to cost for now.  She does have an EpiPen on hand. Lisinopril was stopped.   Current Outpatient Medications   Medication Sig Dispense Refill   ??? amLODIPine (NORVASC) 10 mg tablet Take 1 Tab by mouth daily. 90 Tab 1   ??? hydroCHLOROthiazide (HYDRODIURIL) 25 mg tablet Take 1 Tab by mouth daily. 90 Tab 1   ??? norethindrone-ethinyl estradiol (JUNEL 1/20, 21,) 1-20 mg-mcg tab Take 1 Tab by mouth daily.       Allergies   Allergen Reactions   ??? Codeine Nausea Only     Social History     Tobacco Use   ??? Smoking status: Former Smoker     Last attempt to quit: 04/26/2000     Years since quitting: 17.5   ??? Smokeless tobacco: Never Used   Substance Use Topics   ??? Alcohol use: Yes     Alcohol/week: 4.0 standard drinks     Types: 2 Glasses of wine, 2 Cans of beer per week         Review of Systems   Constitutional: Negative for chills, fever and malaise/fatigue.   HENT: Negative for congestion and sore throat.    Respiratory: Negative for cough, shortness of breath, wheezing and stridor.    Cardiovascular: Negative for chest pain and leg swelling.   Gastrointestinal: Negative for abdominal pain.   Musculoskeletal: Negative for joint pain and myalgias.   Neurological: Negative for dizziness and headaches.         Physical Exam   Constitutional: She appears well-developed. No distress.   HENT:   Nose: Nose normal.   Eyes: Pupils are equal, round, and reactive to light. Conjunctivae and EOM are normal.   Neck: No thyromegaly present.   Cardiovascular: Normal rate, regular rhythm and intact distal pulses.   Pulmonary/Chest: Effort normal and breath sounds normal. She has no wheezes.   Abdominal: Soft. Bowel sounds are normal. She exhibits no distension and no mass. There is no tenderness.    Musculoskeletal: She exhibits no tenderness.   No swelling of extremities.   Lymphadenopathy:     She has no cervical adenopathy.   Neurological: She is alert.   Psychiatric: She has a normal mood and affect.   Nursing note and vitals reviewed.    Visit Vitals  BP 138/88   Pulse 74   Temp 98.8 ??F (37.1 ??C) (Oral)   Resp 16   Ht 5\' 6"  (1.676 m)   Wt 231 lb (104.8 kg)   SpO2 98%   BMI 37.28 kg/m??        Encounter Diagnoses   Name Primary?   ??? Essential hypertension  Borderline in office today at home blood pressures have also been borderline since stopping lisinopril.  We will continue with hydrochlorothiazide and amlodipine and work on lifestyle changes over the next 3 months.  Follow-up in 3 months and if blood pressure not at goal consider adding additional medication.  Avoid ACE and ARBS due to episode of lip swelling as lisinopril may have been the cause. Yes   ??? Hypercholesterolemia  Not at goal and reviewed lifestyle changes.    ??? Severe obesity (BMI 35.0-39.9) with comorbidity (HCC)  Counseled regarding elevated BMI and current weight goals.  Reviewed weight loss strategies including dietary changes and exercise.       ??? Lip swelling  No recurrence and she wants to hold on allergy evaluation for now.  Reviewed red flags and recommendations to go to the emergency room and use of EpiPen.      Orders Placed This Encounter   ??? hydroCHLOROthiazide (HYDRODIURIL) 25 mg tablet   ??? amLODIPine (NORVASC) 10 mg tablet   .        Advised her to call back or return to office if symptoms worsen/change/persist.  Discussed expected course/resolution/complications of diagnosis in detail with patient.    Medication risks/benefits/costs/interactions/alternatives discussed with patient.  She was given an after visit summary which includes diagnoses, current medications, & vitals.  She expressed understanding with the diagnosis and plan.

## 2017-11-11 NOTE — Progress Notes (Signed)
Patient here today for 1 month follow up for hypertension & possible allergic reaction to lisinopril. Has not scheduled an appt to see an allergist.

## 2017-11-11 NOTE — Progress Notes (Signed)
HPI:  Vanessa Mathis is a 42 y.o. year old female who returns to clinic today for an acute visit:   HTN: taking amlodipine and HCTZ. 120-130/80-90's off lisinopril   Lip swelling resolved and no further recurrence and plans to hold on allergist eval due to cost for now.  She does have an EpiPen on hand. Lisinopril was stopped.   Current Outpatient Medications   Medication Sig Dispense Refill   ??? amLODIPine (NORVASC) 10 mg tablet Take 1 Tab by mouth daily. 90 Tab 1   ??? hydroCHLOROthiazide (HYDRODIURIL) 25 mg tablet Take 1 Tab by mouth daily. 90 Tab 1   ??? norethindrone-ethinyl estradiol (JUNEL 1/20, 21,) 1-20 mg-mcg tab Take 1 Tab by mouth daily.       Allergies   Allergen Reactions   ??? Codeine Nausea Only     Social History     Tobacco Use   ??? Smoking status: Former Smoker     Last attempt to quit: 04/26/2000     Years since quitting: 17.5   ??? Smokeless tobacco: Never Used   Substance Use Topics   ??? Alcohol use: Yes     Alcohol/week: 4.0 standard drinks     Types: 2 Glasses of wine, 2 Cans of beer per week         Review of Systems   Constitutional: Negative for chills, fever and malaise/fatigue.   HENT: Negative for congestion and sore throat.    Respiratory: Negative for cough, shortness of breath, wheezing and stridor.    Cardiovascular: Negative for chest pain and leg swelling.   Gastrointestinal: Negative for abdominal pain.   Musculoskeletal: Negative for joint pain and myalgias.   Neurological: Negative for dizziness and headaches.         Physical Exam   Constitutional: She appears well-developed. No distress.   HENT:   Nose: Nose normal.   Eyes: Pupils are equal, round, and reactive to light. Conjunctivae and EOM are normal.   Neck: No thyromegaly present.   Cardiovascular: Normal rate, regular rhythm and intact distal pulses.   Pulmonary/Chest: Effort normal and breath sounds normal. She has no wheezes.   Abdominal: Soft. Bowel sounds are normal. She exhibits no distension and no mass. There is no tenderness.    Musculoskeletal: She exhibits no tenderness.   No swelling of extremities.   Lymphadenopathy:     She has no cervical adenopathy.   Neurological: She is alert.   Psychiatric: She has a normal mood and affect.   Nursing note and vitals reviewed.    Visit Vitals  BP 138/88   Pulse 74   Temp 98.8 ??F (37.1 ??C) (Oral)   Resp 16   Ht 5' 6" (1.676 m)   Wt 231 lb (104.8 kg)   SpO2 98%   BMI 37.28 kg/m??        Encounter Diagnoses   Name Primary?   ??? Essential hypertension  Borderline in office today at home blood pressures have also been borderline since stopping lisinopril.  We will continue with hydrochlorothiazide and amlodipine and work on lifestyle changes over the next 3 months.  Follow-up in 3 months and if blood pressure not at goal consider adding additional medication.  Avoid ACE and ARBS due to episode of lip swelling as lisinopril may have been the cause. Yes   ??? Hypercholesterolemia  Not at goal and reviewed lifestyle changes.    ??? Severe obesity (BMI 35.0-39.9) with comorbidity (HCC)  Counseled regarding elevated BMI and current weight goals.    Reviewed weight loss strategies including dietary changes and exercise.       ??? Lip swelling  No recurrence and she wants to hold on allergy evaluation for now.  Reviewed red flags and recommendations to go to the emergency room and use of EpiPen.      Orders Placed This Encounter   ??? hydroCHLOROthiazide (HYDRODIURIL) 25 mg tablet   ??? amLODIPine (NORVASC) 10 mg tablet   .        Advised her to call back or return to office if symptoms worsen/change/persist.  Discussed expected course/resolution/complications of diagnosis in detail with patient.    Medication risks/benefits/costs/interactions/alternatives discussed with patient.  She was given an after visit summary which includes diagnoses, current medications, & vitals.  She expressed understanding with the diagnosis and plan.

## 2017-11-12 ENCOUNTER — Encounter

## 2018-02-10 ENCOUNTER — Ambulatory Visit: Admit: 2018-02-10 | Payer: PRIVATE HEALTH INSURANCE | Attending: Internal Medicine

## 2018-02-10 ENCOUNTER — Ambulatory Visit: Attending: Internal Medicine

## 2018-02-10 DIAGNOSIS — I10 Essential (primary) hypertension: Secondary | ICD-10-CM

## 2018-02-10 MED ORDER — BUPROPION XL 150 MG 24 HR TAB
150 mg | ORAL_TABLET | ORAL | 0 refills | Status: DC
Start: 2018-02-10 — End: 2018-03-05

## 2018-02-10 NOTE — Progress Notes (Signed)
HPI:  Vanessa Mathis is a 42 y.o. year old female who returns to clinic today for follow up:   1.  Hypertension.:BP 120-130/80-90's.  Taking her medications daily with no side effects.  2.  She complains of increasing depression and anxiety: She is seeing counseling. She is feeling down and having anxiety episodes. She is married and has kids and has a very busy life and usual stressors. No suicidal ideation.    Current Outpatient Medications   Medication Sig Dispense Refill   ??? hydroCHLOROthiazide (HYDRODIURIL) 25 mg tablet Take 1 Tab by mouth daily. 90 Tab 1   ??? amLODIPine (NORVASC) 10 mg tablet Take 1 Tab by mouth daily. 90 Tab 1   ??? norethindrone-ethinyl estradiol (JUNEL 1/20, 21,) 1-20 mg-mcg tab Take 1 Tab by mouth daily.       Allergies   Allergen Reactions   ??? Codeine Nausea Only     Social History     Tobacco Use   ??? Smoking status: Former Smoker     Last attempt to quit: 04/26/2000     Years since quitting: 17.8   ??? Smokeless tobacco: Never Used   Substance Use Topics   ??? Alcohol use: Yes     Alcohol/week: 4.0 standard drinks     Types: 2 Glasses of wine, 2 Cans of beer per week         Review of Systems   Respiratory: Negative for shortness of breath.    Cardiovascular: Negative for chest pain and leg swelling.   Gastrointestinal: Negative for abdominal pain and heartburn.   Neurological: Negative for dizziness and headaches.     Physical Exam   Constitutional: She appears well-developed. No distress.   HENT:   Head: Normocephalic and atraumatic.   Neck: Carotid bruit is not present.   Cardiovascular: Normal rate, regular rhythm, normal heart sounds and intact distal pulses.   No murmur heard.  Pulmonary/Chest: Effort normal and breath sounds normal. She has no wheezes.   Abdominal: Soft. Bowel sounds are normal. She exhibits no distension and no mass. There is no tenderness.   Musculoskeletal: She exhibits no edema.   Psychiatric: She has a normal mood and affect.   Nursing note and vitals reviewed.    Visit  Vitals  BP (!) 142/98   Pulse 86   Temp 98.4 ??F (36.9 ??C) (Oral)   Resp 16   Ht 5\' 6"  (1.676 m)   Wt 232 lb (105.2 kg)   LMP 02/03/2018   SpO2 98%   BMI 37.45 kg/m??       Assessment & Plan:    ICD-10-CM ICD-9-CM    1. Essential hypertension  Blood pressure not at goal and will plan to work on lifestyle changes over the next few weeks.  Check blood pressures.  Bring back at her next visit.  Continue current medications.  Reviewed will need to consider additional medication if not improving. I10 401.9    2. Moderate major depression (HCC)  Newly diagnosed.  Reviewed treatment options.  Will start Wellbutrin and reviewed side effects.  She is particularly concerned not to start any medications that will increase her weight.  If she has any acute worsening of her depression or any suicidal thoughts she will contact me immediately go to the emergency room. F32.1 296.22    3. Anxiety mild   Counseled Wellbutrin may not treat this as well but will see how she does on medication. F41.9 300.00    4. Obesity (BMI 30-39.9)  Counseled regarding elevated BMI and current weight goals.  Reviewed weight loss strategies including dietary changes and exercise.    E66.9 278.00      Orders Placed This Encounter   ??? buPROPion XL (WELLBUTRIN XL) 150 mg tablet       Follow-up 3 weeks.     Advised her to call back or return to office if symptoms worsen/change/persist.  Discussed expected course/resolution/complications of diagnosis in detail with patient.    Medication risks/benefits/costs/interactions/alternatives discussed with patient.  She was given an after visit summary which includes diagnoses, current medications, & vitals.  She expressed understanding with the diagnosis and plan.

## 2018-02-10 NOTE — Progress Notes (Signed)
HPI:  Vanessa Mathis is a 42 y.o. year old female who returns to clinic today for follow up:   1.  Hypertension.:BP 120-130/80-90's.  Taking her medications daily with no side effects.  2.  She complains of increasing depression and anxiety: She is seeing counseling. She is feeling down and having anxiety episodes. She is married and has kids and has a very busy life and usual stressors. No suicidal ideation.    Current Outpatient Medications   Medication Sig Dispense Refill   ??? hydroCHLOROthiazide (HYDRODIURIL) 25 mg tablet Take 1 Tab by mouth daily. 90 Tab 1   ??? amLODIPine (NORVASC) 10 mg tablet Take 1 Tab by mouth daily. 90 Tab 1   ??? norethindrone-ethinyl estradiol (JUNEL 1/20, 21,) 1-20 mg-mcg tab Take 1 Tab by mouth daily.       Allergies   Allergen Reactions   ??? Codeine Nausea Only     Social History     Tobacco Use   ??? Smoking status: Former Smoker     Last attempt to quit: 04/26/2000     Years since quitting: 17.8   ??? Smokeless tobacco: Never Used   Substance Use Topics   ??? Alcohol use: Yes     Alcohol/week: 4.0 standard drinks     Types: 2 Glasses of wine, 2 Cans of beer per week         Review of Systems   Respiratory: Negative for shortness of breath.    Cardiovascular: Negative for chest pain and leg swelling.   Gastrointestinal: Negative for abdominal pain and heartburn.   Neurological: Negative for dizziness and headaches.     Physical Exam   Constitutional: She appears well-developed. No distress.   HENT:   Head: Normocephalic and atraumatic.   Neck: Carotid bruit is not present.   Cardiovascular: Normal rate, regular rhythm, normal heart sounds and intact distal pulses.   No murmur heard.  Pulmonary/Chest: Effort normal and breath sounds normal. She has no wheezes.   Abdominal: Soft. Bowel sounds are normal. She exhibits no distension and no mass. There is no tenderness.   Musculoskeletal: She exhibits no edema.   Psychiatric: She has a normal mood and affect.   Nursing note and vitals reviewed.     Visit Vitals  BP (!) 142/98   Pulse 86   Temp 98.4 ??F (36.9 ??C) (Oral)   Resp 16   Ht 5\' 6"  (1.676 m)   Wt 232 lb (105.2 kg)   LMP 02/03/2018   SpO2 98%   BMI 37.45 kg/m??       Assessment & Plan:    ICD-10-CM ICD-9-CM    1. Essential hypertension  Blood pressure not at goal and will plan to work on lifestyle changes over the next few weeks.  Check blood pressures.  Bring back at her next visit.  Continue current medications.  Reviewed will need to consider additional medication if not improving. I10 401.9    2. Moderate major depression (HCC)  Newly diagnosed.  Reviewed treatment options.  Will start Wellbutrin and reviewed side effects.  She is particularly concerned not to start any medications that will increase her weight.  If she has any acute worsening of her depression or any suicidal thoughts she will contact me immediately go to the emergency room. F32.1 296.22    3. Anxiety mild   Counseled Wellbutrin may not treat this as well but will see how she does on medication. F41.9 300.00    4. Obesity (BMI 30-39.9)  Counseled regarding elevated BMI and current weight goals.  Reviewed weight loss strategies including dietary changes and exercise.    E66.9 278.00      Orders Placed This Encounter   ??? buPROPion XL (WELLBUTRIN XL) 150 mg tablet       Follow-up 3 weeks.     Advised her to call back or return to office if symptoms worsen/change/persist.  Discussed expected course/resolution/complications of diagnosis in detail with patient.    Medication risks/benefits/costs/interactions/alternatives discussed with patient.  She was given an after visit summary which includes diagnoses, current medications, & vitals.  She expressed understanding with the diagnosis and plan.

## 2018-03-03 ENCOUNTER — Encounter: Attending: Internal Medicine

## 2018-03-05 ENCOUNTER — Ambulatory Visit: Admit: 2018-03-05 | Discharge: 2018-03-05 | Payer: PRIVATE HEALTH INSURANCE | Attending: Internal Medicine

## 2018-03-05 ENCOUNTER — Ambulatory Visit: Attending: Internal Medicine

## 2018-03-05 DIAGNOSIS — I10 Essential (primary) hypertension: Secondary | ICD-10-CM

## 2018-03-05 MED ORDER — SPIRONOLACTONE 25 MG TAB
25 mg | ORAL_TABLET | Freq: Every day | ORAL | 0 refills | Status: DC
Start: 2018-03-05 — End: 2018-04-02

## 2018-03-05 MED ORDER — BUPROPION XL 150 MG 24 HR TAB
150 mg | ORAL_TABLET | ORAL | 1 refills | Status: DC
Start: 2018-03-05 — End: 2018-09-03

## 2018-03-05 NOTE — Progress Notes (Signed)
HPI:  Vanessa Mathis is a 42 y.o. year old female who returns to clinic today for follow up: HTN  And depression, anxious.   1. Cardiovascular: She reports taking medications as instructed, no medication side effects noted, The home BP readings outside of office have been in the 130's / 80-90's range. .  Diet and Lifestyle: generally follows a low fat low cholesterol diet, generally follows a low sodium diet. Exercise: spin on her pelaton.   2. Depression and anxiety: Significantly improved and has not been feeling depressed over the past week.  No anxiety.  She continues to see her counselor.  Side effect: Possibly noting some dry mouth.  She is increasing her fluid intake.  Current Outpatient Medications   Medication Sig Dispense Refill   ??? buPROPion XL (WELLBUTRIN XL) 150 mg tablet Take 1 Tab by mouth every morning. 30 Tab 0   ??? hydroCHLOROthiazide (HYDRODIURIL) 25 mg tablet Take 1 Tab by mouth daily. 90 Tab 1   ??? amLODIPine (NORVASC) 10 mg tablet Take 1 Tab by mouth daily. 90 Tab 1   ??? norethindrone-ethinyl estradiol (JUNEL 1/20, 21,) 1-20 mg-mcg tab Take 1 Tab by mouth daily.       Allergies   Allergen Reactions   ??? Codeine Nausea Only   ??? Lisinopril Other (comments)     Lip swelling     Social History     Tobacco Use   ??? Smoking status: Former Smoker     Last attempt to quit: 04/26/2000     Years since quitting: 17.8   ??? Smokeless tobacco: Never Used   Substance Use Topics   ??? Alcohol use: Yes     Alcohol/week: 4.0 standard drinks     Types: 2 Glasses of wine, 2 Cans of beer per week         Review of Systems   Constitutional: Negative for malaise/fatigue.   Respiratory: Negative for shortness of breath.    Cardiovascular: Negative for chest pain and leg swelling.   Gastrointestinal: Negative for abdominal pain and heartburn.   Neurological: Negative for dizziness and headaches.     Physical Exam   Constitutional: She appears well-developed. No distress.   HENT:   Head: Normocephalic and atraumatic.   Cardiovascular:  Normal rate, regular rhythm and intact distal pulses.   Pulmonary/Chest: Effort normal and breath sounds normal. She has no wheezes.   Abdominal: Soft. Bowel sounds are normal. She exhibits no distension and no mass. There is no tenderness.   Musculoskeletal: She exhibits no edema.   Psychiatric: She has a normal mood and affect.   Nursing note and vitals reviewed.    Visit Vitals  BP 148/90   Pulse 83   Temp 97.9 ??F (36.6 ??C) (Oral)   Resp 16   Ht 5\' 6"  (1.676 m)   Wt 231 lb (104.8 kg)   LMP 03/03/2018   SpO2 99%   BMI 37.28 kg/m??       Assessment & Plan:    ICD-10-CM ICD-9-CM    1. Essential hypertension  Not at goal. Will add spironolactone. Continue current HCTZ and amlodipine. Check BP and send to review on my chart in 2 weeks.  Reviewed lifestyle changes avoidance of salt and exercises. EKG is fine. I10 401.9 METABOLIC PANEL, COMPREHENSIVE      TSH REFLEX TO T4      AMB POC EKG ROUTINE W/ 12 LEADS, INTER & REP   2. Depression, controlled   continue current medication.  Continue also  with counseling. F32.9 311    3. Anxiety  Well controlled, continue current medication.  F41.9 300.00    4. Hypercholesterolemia  Check level.  Have been managing with lifestyle alone. E78.00 272.0 LIPID PANEL     Orders Placed This Encounter   ??? METABOLIC PANEL, COMPREHENSIVE   ??? LIPID PANEL   ??? TSH REFLEX TO T4   ??? AMB POC EKG ROUTINE W/ 12 LEADS, INTER & REP   Follow-up in 4 weeks.       Advised her to call back or return to office if symptoms worsen/change/persist.  Discussed expected course/resolution/complications of diagnosis in detail with patient.    Medication risks/benefits/costs/interactions/alternatives discussed with patient.  She was given an after visit summary which includes diagnoses, current medications, & vitals.  She expressed understanding with the diagnosis and plan.

## 2018-03-05 NOTE — Progress Notes (Signed)
My chart message sent that lab work fine except significantly elevated cholesterol. Lifestyle changes recommended and will discuss further at upcoming visit..  Plan to repeat cholesterol in 3 months

## 2018-03-05 NOTE — Progress Notes (Signed)
My chart message sent that lab work fine except significantly elevated cholesterol. Lifestyle changes recommended and will discuss further at upcoming visit..  Plan to repeat cholesterol in 3 months

## 2018-03-05 NOTE — Progress Notes (Signed)
HPI:  Vanessa Mathis is a 42 y.o. year old female who returns to clinic today for follow up: HTN  And depression, anxious.   1. Cardiovascular: She reports taking medications as instructed, no medication side effects noted, The home BP readings outside of office have been in the 130's / 80-90's range. .  Diet and Lifestyle: generally follows a low fat low cholesterol diet, generally follows a low sodium diet. Exercise: spin on her pelaton.   2. Depression and anxiety: Significantly improved and has not been feeling depressed over the past week.  No anxiety.  She continues to see her counselor.  Side effect: Possibly noting some dry mouth.  She is increasing her fluid intake.  Current Outpatient Medications   Medication Sig Dispense Refill   ??? buPROPion XL (WELLBUTRIN XL) 150 mg tablet Take 1 Tab by mouth every morning. 30 Tab 0   ??? hydroCHLOROthiazide (HYDRODIURIL) 25 mg tablet Take 1 Tab by mouth daily. 90 Tab 1   ??? amLODIPine (NORVASC) 10 mg tablet Take 1 Tab by mouth daily. 90 Tab 1   ??? norethindrone-ethinyl estradiol (JUNEL 1/20, 21,) 1-20 mg-mcg tab Take 1 Tab by mouth daily.       Allergies   Allergen Reactions   ??? Codeine Nausea Only   ??? Lisinopril Other (comments)     Lip swelling     Social History     Tobacco Use   ??? Smoking status: Former Smoker     Last attempt to quit: 04/26/2000     Years since quitting: 17.8   ??? Smokeless tobacco: Never Used   Substance Use Topics   ??? Alcohol use: Yes     Alcohol/week: 4.0 standard drinks     Types: 2 Glasses of wine, 2 Cans of beer per week         Review of Systems   Constitutional: Negative for malaise/fatigue.   Respiratory: Negative for shortness of breath.    Cardiovascular: Negative for chest pain and leg swelling.   Gastrointestinal: Negative for abdominal pain and heartburn.   Neurological: Negative for dizziness and headaches.     Physical Exam   Constitutional: She appears well-developed. No distress.   HENT:   Head: Normocephalic and atraumatic.    Cardiovascular: Normal rate, regular rhythm and intact distal pulses.   Pulmonary/Chest: Effort normal and breath sounds normal. She has no wheezes.   Abdominal: Soft. Bowel sounds are normal. She exhibits no distension and no mass. There is no tenderness.   Musculoskeletal: She exhibits no edema.   Psychiatric: She has a normal mood and affect.   Nursing note and vitals reviewed.    Visit Vitals  BP 148/90   Pulse 83   Temp 97.9 ??F (36.6 ??C) (Oral)   Resp 16   Ht 5\' 6"  (1.676 m)   Wt 231 lb (104.8 kg)   LMP 03/03/2018   SpO2 99%   BMI 37.28 kg/m??       Assessment & Plan:    ICD-10-CM ICD-9-CM    1. Essential hypertension  Not at goal. Will add spironolactone. Continue current HCTZ and amlodipine. Check BP and send to review on my chart in 2 weeks.  Reviewed lifestyle changes avoidance of salt and exercises. EKG is fine. I10 401.9 METABOLIC PANEL, COMPREHENSIVE      TSH REFLEX TO T4      AMB POC EKG ROUTINE W/ 12 LEADS, INTER & REP   2. Depression, controlled   continue current medication.  Continue also  with counseling. F32.9 311    3. Anxiety  Well controlled, continue current medication.  F41.9 300.00    4. Hypercholesterolemia  Check level.  Have been managing with lifestyle alone. E78.00 272.0 LIPID PANEL     Orders Placed This Encounter   ??? METABOLIC PANEL, COMPREHENSIVE   ??? LIPID PANEL   ??? TSH REFLEX TO T4   ??? AMB POC EKG ROUTINE W/ 12 LEADS, INTER & REP   Follow-up in 4 weeks.       Advised her to call back or return to office if symptoms worsen/change/persist.  Discussed expected course/resolution/complications of diagnosis in detail with patient.    Medication risks/benefits/costs/interactions/alternatives discussed with patient.  She was given an after visit summary which includes diagnoses, current medications, & vitals.  She expressed understanding with the diagnosis and plan.

## 2018-03-06 LAB — COMPREHENSIVE METABOLIC PANEL
ALT: 20 IU/L (ref 0–32)
AST: 22 IU/L (ref 0–40)
Albumin/Globulin Ratio: 1.4 NA (ref 1.2–2.2)
Albumin: 4.2 g/dL (ref 3.5–5.5)
Alkaline Phosphatase: 67 IU/L (ref 39–117)
BUN: 9 mg/dL (ref 6–24)
Bun/Cre Ratio: 11 NA (ref 9–23)
CO2: 22 mmol/L (ref 20–29)
Calcium: 9.1 mg/dL (ref 8.7–10.2)
Chloride: 99 mmol/L (ref 96–106)
Creatinine: 0.83 mg/dL (ref 0.57–1.00)
EGFR IF NonAfrican American: 87 mL/min/{1.73_m2} (ref >59)
GFR African American: 100 mL/min/{1.73_m2} (ref >59)
Globulin, Total: 2.9 g/dL (ref 1.5–4.5)
Glucose: 85 mg/dL (ref 65–99)
Potassium: 4.2 mmol/L (ref 3.5–5.2)
Sodium: 138 mmol/L (ref 134–144)
Total Bilirubin: 0.2 mg/dL (ref 0.0–1.2)
Total Protein: 7.1 g/dL (ref 6.0–8.5)

## 2018-03-06 LAB — TSH, REFLEX TO T4: TSH: 3.14 u[IU]/mL (ref 0.450–4.500)

## 2018-03-06 LAB — LIPID PANEL
Cholesterol, Total: 299 mg/dL — ABNORMAL HIGH (ref 100–199)
Cholesterol, total: 299 mg/dL — ABNORMAL HIGH (ref 100–199)
HDL Cholesterol: 64 mg/dL (ref >39)
HDL: 64 mg/dL (ref >39)
LDL Calculated: 209 mg/dL — ABNORMAL HIGH (ref 0–99)
LDL, calculated: 209 mg/dL — ABNORMAL HIGH (ref 0–99)
Triglyceride: 129 mg/dL (ref 0–149)
Triglycerides: 129 mg/dL (ref 0–149)
VLDL Cholesterol Calculated: 26 mg/dL (ref 5–40)
VLDL, calculated: 26 mg/dL (ref 5–40)

## 2018-03-06 LAB — METABOLIC PANEL, COMPREHENSIVE
A-G Ratio: 1.4 (ref 1.2–2.2)
ALT (SGPT): 20 IU/L (ref 0–32)
AST (SGOT): 22 IU/L (ref 0–40)
Albumin: 4.2 g/dL (ref 3.5–5.5)
Alk. phosphatase: 67 IU/L (ref 39–117)
BUN/Creatinine ratio: 11 (ref 9–23)
BUN: 9 mg/dL (ref 6–24)
Bilirubin, total: 0.2 mg/dL (ref 0.0–1.2)
CO2: 22 mmol/L (ref 20–29)
Calcium: 9.1 mg/dL (ref 8.7–10.2)
Chloride: 99 mmol/L (ref 96–106)
Creatinine: 0.83 mg/dL (ref 0.57–1.00)
GFR est AA: 100 mL/min/{1.73_m2} (ref >59)
GFR est non-AA: 87 mL/min/{1.73_m2} (ref >59)
GLOBULIN, TOTAL: 2.9 g/dL (ref 1.5–4.5)
Glucose: 85 mg/dL (ref 65–99)
Potassium: 4.2 mmol/L (ref 3.5–5.2)
Protein, total: 7.1 g/dL (ref 6.0–8.5)
Sodium: 138 mmol/L (ref 134–144)

## 2018-03-06 LAB — TSH REFLEX TO T4: TSH: 3.14 u[IU]/mL (ref 0.450–4.500)

## 2018-04-02 ENCOUNTER — Ambulatory Visit: Admit: 2018-04-02 | Discharge: 2018-04-02 | Payer: PRIVATE HEALTH INSURANCE | Attending: Internal Medicine

## 2018-04-02 ENCOUNTER — Ambulatory Visit: Attending: Internal Medicine

## 2018-04-02 DIAGNOSIS — I10 Essential (primary) hypertension: Secondary | ICD-10-CM

## 2018-04-02 MED ORDER — SPIRONOLACTONE 50 MG TAB
50 mg | ORAL_TABLET | Freq: Every day | ORAL | 0 refills | Status: DC
Start: 2018-04-02 — End: 2018-04-30

## 2018-04-02 MED ORDER — ATORVASTATIN 20 MG TAB
20 mg | ORAL_TABLET | Freq: Every day | ORAL | 0 refills | Status: DC
Start: 2018-04-02 — End: 2018-06-09

## 2018-04-02 NOTE — Progress Notes (Signed)
HPI:  Vanessa Mathis is a 42 y.o. year old female who returns to clinic today for follow up: HTN: BP 130's/90-95.  Has tolerated addition of spironolactone but does not feel blood pressures have improved significantly.  She exercises regularly.  hypercholesterolemia: Has been on Lipitor in the distant past and  After lap band surgery was able to lose 80 pounds and stop the Lipitor.  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  She does not plan to have any further bariatric surgery.  Current Outpatient Medications   Medication Sig Dispense Refill   ??? spironolactone (ALDACTONE) 25 mg tablet Take 1 Tab by mouth daily. 30 Tab 0   ??? buPROPion XL (WELLBUTRIN XL) 150 mg tablet Take 1 Tab by mouth every morning. 90 Tab 1   ??? hydroCHLOROthiazide (HYDRODIURIL) 25 mg tablet Take 1 Tab by mouth daily. 90 Tab 1   ??? amLODIPine (NORVASC) 10 mg tablet Take 1 Tab by mouth daily. 90 Tab 1   ??? norethindrone-ethinyl estradiol (JUNEL 1/20, 21,) 1-20 mg-mcg tab Take 1 Tab by mouth daily.       Allergies   Allergen Reactions   ??? Codeine Nausea Only   ??? Lisinopril Other (comments)     Lip swelling     Social History     Tobacco Use   ??? Smoking status: Former Smoker     Last attempt to quit: 04/26/2000     Years since quitting: 17.9   ??? Smokeless tobacco: Never Used   Substance Use Topics   ??? Alcohol use: Yes     Alcohol/week: 4.0 standard drinks     Types: 2 Glasses of wine, 2 Cans of beer per week         Review of Systems   Constitutional: Negative for malaise/fatigue.   Respiratory: Negative for shortness of breath.    Cardiovascular: Negative for chest pain and leg swelling.   Gastrointestinal: Negative for abdominal pain and heartburn.   Neurological: Negative for dizziness and headaches.     Physical Exam  Vitals signs and nursing note reviewed.   Constitutional:       General: She is not in acute distress.     Appearance: She is well-developed.   HENT:      Head: Normocephalic  and atraumatic.   Cardiovascular:      Rate and Rhythm: Normal rate and regular rhythm.   Pulmonary:      Effort: Pulmonary effort is normal.      Breath sounds: Normal breath sounds. No wheezing.   Abdominal:      General: Bowel sounds are normal. There is no distension.      Palpations: Abdomen is soft. There is no mass.      Tenderness: There is no tenderness.   Musculoskeletal:      Right lower leg: No edema.      Left lower leg: No edema.       Visit Vitals  BP 136/80 (BP 1 Location: Right arm)   Pulse 91   Temp 97.7 ??F (36.5 ??C) (Oral)   Resp 18   Ht 5\' 6"  (1.676 m)   Wt 233 lb (105.7 kg)   LMP 03/03/2018   SpO2 100%   BMI 37.61 kg/m??       Assessment & Plan:    ICD-10-CM ICD-9-CM    1. Essential hypertension  Not at goal and will increase spironolactone to 50 mg daily.  Check BMP.  I10 401.9 METABOLIC PANEL, BASIC   2. Hypercholesterolemia  Not at goal.  Reviewed lifestyle changes.  Given significant elevation will restart Lipitor which she has been on in the distant past and tolerated well.  E78.00 272.0       Orders Placed This Encounter   ??? METABOLIC PANEL, BASIC   ??? spironolactone (ALDACTONE) 50 mg tablet   ??? atorvastatin (LIPITOR) 20 mg tablet   Follow-up in 4 weeks.       Advised her to call back or return to office if symptoms worsen/change/persist.  Discussed expected course/resolution/complications of diagnosis in detail with patient.    Medication risks/benefits/costs/interactions/alternatives discussed with patient.  She was given an after visit summary which includes diagnoses, current medications, & vitals.  She expressed understanding with the diagnosis and plan.

## 2018-04-02 NOTE — Progress Notes (Signed)
Results released to patient via MyChart.  All labs are stable or at goal for her.

## 2018-04-02 NOTE — Progress Notes (Signed)
HPI:  Vanessa Mathis is a 42 y.o. year old female who returns to clinic today for follow up: HTN: BP 130's/90-95.  Has tolerated addition of spironolactone but does not feel blood pressures have improved significantly.  She exercises regularly.  hypercholesterolemia: Has been on Lipitor in the distant past and  After lap band surgery was able to lose 80 pounds and stop the Lipitor.  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  She does not plan to have any further bariatric surgery.  Current Outpatient Medications   Medication Sig Dispense Refill   ??? spironolactone (ALDACTONE) 25 mg tablet Take 1 Tab by mouth daily. 30 Tab 0   ??? buPROPion XL (WELLBUTRIN XL) 150 mg tablet Take 1 Tab by mouth every morning. 90 Tab 1   ??? hydroCHLOROthiazide (HYDRODIURIL) 25 mg tablet Take 1 Tab by mouth daily. 90 Tab 1   ??? amLODIPine (NORVASC) 10 mg tablet Take 1 Tab by mouth daily. 90 Tab 1   ??? norethindrone-ethinyl estradiol (JUNEL 1/20, 21,) 1-20 mg-mcg tab Take 1 Tab by mouth daily.       Allergies   Allergen Reactions   ??? Codeine Nausea Only   ??? Lisinopril Other (comments)     Lip swelling     Social History     Tobacco Use   ??? Smoking status: Former Smoker     Last attempt to quit: 04/26/2000     Years since quitting: 17.9   ??? Smokeless tobacco: Never Used   Substance Use Topics   ??? Alcohol use: Yes     Alcohol/week: 4.0 standard drinks     Types: 2 Glasses of wine, 2 Cans of beer per week         Review of Systems   Constitutional: Negative for malaise/fatigue.   Respiratory: Negative for shortness of breath.    Cardiovascular: Negative for chest pain and leg swelling.   Gastrointestinal: Negative for abdominal pain and heartburn.   Neurological: Negative for dizziness and headaches.     Physical Exam  Vitals signs and nursing note reviewed.   Constitutional:       General: She is not in acute distress.     Appearance: She is well-developed.   HENT:       Head: Normocephalic and atraumatic.   Cardiovascular:      Rate and Rhythm: Normal rate and regular rhythm.   Pulmonary:      Effort: Pulmonary effort is normal.      Breath sounds: Normal breath sounds. No wheezing.   Abdominal:      General: Bowel sounds are normal. There is no distension.      Palpations: Abdomen is soft. There is no mass.      Tenderness: There is no tenderness.   Musculoskeletal:      Right lower leg: No edema.      Left lower leg: No edema.       Visit Vitals  BP 136/80 (BP 1 Location: Right arm)   Pulse 91   Temp 97.7 ??F (36.5 ??C) (Oral)   Resp 18   Ht 5\' 6"  (1.676 m)   Wt 233 lb (105.7 kg)   LMP 03/03/2018   SpO2 100%   BMI 37.61 kg/m??       Assessment & Plan:    ICD-10-CM ICD-9-CM    1. Essential hypertension  Not at goal and will increase spironolactone to 50 mg daily.  Check BMP.  I10 401.9 METABOLIC PANEL, BASIC   2. Hypercholesterolemia  Not at goal.  Reviewed lifestyle changes.  Given significant elevation will restart Lipitor which she has been on in the distant past and tolerated well.  E78.00 272.0       Orders Placed This Encounter   ??? METABOLIC PANEL, BASIC   ??? spironolactone (ALDACTONE) 50 mg tablet   ??? atorvastatin (LIPITOR) 20 mg tablet   Follow-up in 4 weeks.       Advised her to call back or return to office if symptoms worsen/change/persist.  Discussed expected course/resolution/complications of diagnosis in detail with patient.    Medication risks/benefits/costs/interactions/alternatives discussed with patient.  She was given an after visit summary which includes diagnoses, current medications, & vitals.  She expressed understanding with the diagnosis and plan.

## 2018-04-04 LAB — BASIC METABOLIC PANEL
BUN: 9 mg/dL (ref 6–24)
Bun/Cre Ratio: 11 NA (ref 9–23)
CO2: 21 mmol/L (ref 20–29)
Calcium: 8.8 mg/dL (ref 8.7–10.2)
Chloride: 100 mmol/L (ref 96–106)
Creatinine: 0.81 mg/dL (ref 0.57–1.00)
EGFR IF NonAfrican American: 89 mL/min/{1.73_m2} (ref >59)
GFR African American: 103 mL/min/{1.73_m2} (ref >59)
Glucose: 83 mg/dL (ref 65–99)
Potassium: 4.1 mmol/L (ref 3.5–5.2)
Sodium: 135 mmol/L (ref 134–144)

## 2018-04-04 LAB — METABOLIC PANEL, BASIC
BUN/Creatinine ratio: 11 (ref 9–23)
BUN: 9 mg/dL (ref 6–24)
CO2: 21 mmol/L (ref 20–29)
Calcium: 8.8 mg/dL (ref 8.7–10.2)
Chloride: 100 mmol/L (ref 96–106)
Creatinine: 0.81 mg/dL (ref 0.57–1.00)
GFR est AA: 103 mL/min/{1.73_m2} (ref >59)
GFR est non-AA: 89 mL/min/{1.73_m2} (ref >59)
Glucose: 83 mg/dL (ref 65–99)
Potassium: 4.1 mmol/L (ref 3.5–5.2)
Sodium: 135 mmol/L (ref 134–144)

## 2018-04-30 ENCOUNTER — Ambulatory Visit: Admit: 2018-04-30 | Discharge: 2018-04-30 | Payer: PRIVATE HEALTH INSURANCE | Attending: Internal Medicine

## 2018-04-30 ENCOUNTER — Ambulatory Visit: Attending: Internal Medicine

## 2018-04-30 DIAGNOSIS — I10 Essential (primary) hypertension: Secondary | ICD-10-CM

## 2018-04-30 MED ORDER — METOPROLOL SUCCINATE SR 50 MG 24 HR TAB
50 mg | ORAL_TABLET | Freq: Every day | ORAL | 0 refills | Status: DC
Start: 2018-04-30 — End: 2018-06-03

## 2018-04-30 NOTE — Progress Notes (Signed)
HPI:  Vanessa Mathis is a 43 y.o. year old female who returns to clinic today for follow up:   Hypertension: Home BP 130-150/80-100.  She really does not note any significant improvement with addition of spironolactone even with increase in dose.  She is tolerating blood pressure medications well with no side effects.  She is working on significant lifestyle changes and avoidance of salt.    Current Outpatient Medications   Medication Sig Dispense Refill   ??? spironolactone (ALDACTONE) 50 mg tablet Take 1 Tab by mouth daily. 90 Tab 0   ??? atorvastatin (LIPITOR) 20 mg tablet Take 1 Tab by mouth daily. 90 Tab 0   ??? buPROPion XL (WELLBUTRIN XL) 150 mg tablet Take 1 Tab by mouth every morning. 90 Tab 1   ??? hydroCHLOROthiazide (HYDRODIURIL) 25 mg tablet Take 1 Tab by mouth daily. 90 Tab 1   ??? amLODIPine (NORVASC) 10 mg tablet Take 1 Tab by mouth daily. 90 Tab 1   ??? norethindrone-ethinyl estradiol (JUNEL 1/20, 21,) 1-20 mg-mcg tab Take 1 Tab by mouth daily.       Allergies   Allergen Reactions   ??? Codeine Nausea Only   ??? Lisinopril Other (comments)     Lip swelling     Social History     Tobacco Use   ??? Smoking status: Former Smoker     Last attempt to quit: 04/26/2000     Years since quitting: 18.0   ??? Smokeless tobacco: Never Used   Substance Use Topics   ??? Alcohol use: Yes     Alcohol/week: 4.0 standard drinks     Types: 2 Glasses of wine, 2 Cans of beer per week         Review of Systems   Respiratory: Negative for shortness of breath.    Cardiovascular: Negative for chest pain and leg swelling.   Gastrointestinal: Negative for abdominal pain.     Physical Exam  Vitals signs and nursing note reviewed.   Constitutional:       General: She is not in acute distress.     Appearance: She is well-developed.   HENT:      Head: Normocephalic and atraumatic.   Cardiovascular:      Rate and Rhythm: Normal rate and regular rhythm.   Pulmonary:      Effort: Pulmonary effort is normal.      Breath sounds: Normal breath sounds. No wheezing.    Abdominal:      General: Bowel sounds are normal. There is no distension.      Palpations: Abdomen is soft. There is no mass.      Tenderness: There is no tenderness.       Visit Vitals  BP 136/90 (BP 1 Location: Right arm)   Pulse 85   Temp 98.4 ??F (36.9 ??C) (Oral)   Resp 18   Ht 5\' 6"  (1.676 m)   Wt 231 lb (104.8 kg)   LMP 04/21/2018   SpO2 99%   BMI 37.28 kg/m??       Assessment & Plan:    ICD-10-CM ICD-9-CM    1. Essential hypertension  Not at goal and spironolactone does not seem to have improved blood pressure.  Will add metoprolol succinate 50 mg XL every morning.  Reviewed potential side effects of medication.  Check blood pressures and follow-up in 4 weeks. I10 401.9    2. Hypercholesterolemia  Continue current medication E78.00 272.0 METABOLIC PANEL, COMPREHENSIVE      LIPID PANEL  Orders Placed This Encounter   ??? METABOLIC PANEL, COMPREHENSIVE   ??? LIPID PANEL   ??? metoprolol succinate (TOPROL-XL) 50 mg XL tablet       4-week follow-up     Advised her to call back or return to office if symptoms worsen/change/persist.  Discussed expected course/resolution/complications of diagnosis in detail with patient.    Medication risks/benefits/costs/interactions/alternatives discussed with patient.  She was given an after visit summary which includes diagnoses, current medications, & vitals.  She expressed understanding with the diagnosis and plan.

## 2018-05-08 ENCOUNTER — Encounter

## 2018-05-09 MED ORDER — AMLODIPINE 10 MG TAB
10 mg | ORAL_TABLET | ORAL | 1 refills | Status: DC
Start: 2018-05-09 — End: 2018-09-08

## 2018-05-09 MED ORDER — HYDROCHLOROTHIAZIDE 25 MG TAB
25 mg | ORAL_TABLET | ORAL | 1 refills | Status: DC
Start: 2018-05-09 — End: 2018-11-09

## 2018-05-29 LAB — COMPREHENSIVE METABOLIC PANEL
ALT: 19 IU/L (ref 0–32)
AST: 12 IU/L (ref 0–40)
Albumin/Globulin Ratio: 1.7 NA (ref 1.2–2.2)
Albumin: 4.2 g/dL (ref 3.8–4.8)
Alkaline Phosphatase: 65 IU/L (ref 39–117)
BUN: 9 mg/dL (ref 6–24)
Bun/Cre Ratio: 12 NA (ref 9–23)
CO2: 20 mmol/L (ref 20–29)
Calcium: 9.1 mg/dL (ref 8.7–10.2)
Chloride: 98 mmol/L (ref 96–106)
Creatinine: 0.77 mg/dL (ref 0.57–1.00)
EGFR IF NonAfrican American: 95 mL/min/{1.73_m2} (ref >59)
GFR African American: 109 mL/min/{1.73_m2} (ref >59)
Globulin, Total: 2.5 g/dL (ref 1.5–4.5)
Glucose: 89 mg/dL (ref 65–99)
Potassium: 4.1 mmol/L (ref 3.5–5.2)
Sodium: 137 mmol/L (ref 134–144)
Total Bilirubin: 0.3 mg/dL (ref 0.0–1.2)
Total Protein: 6.7 g/dL (ref 6.0–8.5)

## 2018-05-29 LAB — LIPID PANEL
Cholesterol, Total: 194 mg/dL (ref 100–199)
Cholesterol, total: 194 mg/dL (ref 100–199)
HDL Cholesterol: 52 mg/dL (ref >39)
HDL: 52 mg/dL (ref >39)
LDL Calculated: 113 mg/dL — ABNORMAL HIGH (ref 0–99)
LDL, calculated: 113 mg/dL — ABNORMAL HIGH (ref 0–99)
Triglyceride: 146 mg/dL (ref 0–149)
Triglycerides: 146 mg/dL (ref 0–149)
VLDL Cholesterol Calculated: 29 mg/dL (ref 5–40)
VLDL, calculated: 29 mg/dL (ref 5–40)

## 2018-05-29 LAB — METABOLIC PANEL, COMPREHENSIVE
A-G Ratio: 1.7 (ref 1.2–2.2)
ALT (SGPT): 19 IU/L (ref 0–32)
AST (SGOT): 12 IU/L (ref 0–40)
Albumin: 4.2 g/dL (ref 3.8–4.8)
Alk. phosphatase: 65 IU/L (ref 39–117)
BUN/Creatinine ratio: 12 (ref 9–23)
BUN: 9 mg/dL (ref 6–24)
Bilirubin, total: 0.3 mg/dL (ref 0.0–1.2)
CO2: 20 mmol/L (ref 20–29)
Calcium: 9.1 mg/dL (ref 8.7–10.2)
Chloride: 98 mmol/L (ref 96–106)
Creatinine: 0.77 mg/dL (ref 0.57–1.00)
GFR est AA: 109 mL/min/{1.73_m2} (ref >59)
GFR est non-AA: 95 mL/min/{1.73_m2} (ref >59)
GLOBULIN, TOTAL: 2.5 g/dL (ref 1.5–4.5)
Glucose: 89 mg/dL (ref 65–99)
Potassium: 4.1 mmol/L (ref 3.5–5.2)
Protein, total: 6.7 g/dL (ref 6.0–8.5)
Sodium: 137 mmol/L (ref 134–144)

## 2018-06-02 ENCOUNTER — Encounter: Attending: Internal Medicine

## 2018-06-03 MED ORDER — METOPROLOL SUCCINATE SR 50 MG 24 HR TAB
50 mg | ORAL_TABLET | ORAL | 0 refills | Status: DC
Start: 2018-06-03 — End: 2018-06-09

## 2018-06-09 ENCOUNTER — Ambulatory Visit: Admit: 2018-06-09 | Payer: PRIVATE HEALTH INSURANCE | Attending: Internal Medicine

## 2018-06-09 ENCOUNTER — Ambulatory Visit: Attending: Internal Medicine

## 2018-06-09 DIAGNOSIS — I10 Essential (primary) hypertension: Secondary | ICD-10-CM

## 2018-06-09 LAB — AMB POC RAPID INFLUENZA TEST
QuickVue Influenza test: NEGATIVE
QuickVue Influenza test: NEGATIVE

## 2018-06-09 MED ORDER — METOPROLOL SUCCINATE SR 50 MG 24 HR TAB
50 mg | ORAL_TABLET | ORAL | 1 refills | Status: DC
Start: 2018-06-09 — End: 2018-07-06

## 2018-06-09 MED ORDER — ATORVASTATIN 20 MG TAB
20 mg | ORAL_TABLET | Freq: Every day | ORAL | 1 refills | Status: DC
Start: 2018-06-09 — End: 2019-01-04

## 2018-06-09 NOTE — Progress Notes (Signed)
HPI:  Vanessa Mathis is a 43 y.o. year old female who returns to clinic today for follow up: Hypertension and hypercholesterolemia  HTN: BP at home 120-130/70-80's since started metoprolol.  No side effects on medication.  She is also working on lifestyle changes.  Hypercholesterolemia: She is tolerated initiation of atorvastatin with no side effects denies myalgias.  Lab work with her today which is significantly improved.  Son sick and today she has HA and feels mild sore throat.   Current Outpatient Medications   Medication Sig Dispense Refill   ??? metoprolol succinate (TOPROL-XL) 50 mg XL tablet TAKE 1 TABLET BY MOUTH DAILY 30 Tab 0   ??? hydroCHLOROthiazide (HYDRODIURIL) 25 mg tablet TAKE 1 TABLET BY MOUTH DAILY 90 Tab 1   ??? amLODIPine (NORVASC) 10 mg tablet TAKE 1 TABLET BY MOUTH DAILY 90 Tab 1   ??? atorvastatin (LIPITOR) 20 mg tablet Take 1 Tab by mouth daily. 90 Tab 0   ??? buPROPion XL (WELLBUTRIN XL) 150 mg tablet Take 1 Tab by mouth every morning. 90 Tab 1   ??? norethindrone-ethinyl estradiol (JUNEL 1/20, 21,) 1-20 mg-mcg tab Take 1 Tab by mouth daily.       Allergies   Allergen Reactions   ??? Codeine Nausea Only   ??? Lisinopril Other (comments)     Lip swelling     Social History     Tobacco Use   ??? Smoking status: Former Smoker     Last attempt to quit: 04/26/2000     Years since quitting: 18.1   ??? Smokeless tobacco: Never Used   Substance Use Topics   ??? Alcohol use: Yes     Alcohol/week: 4.0 standard drinks     Types: 2 Glasses of wine, 2 Cans of beer per week         Review of Systems   Constitutional: Negative for malaise/fatigue.   Respiratory: Negative for shortness of breath.    Cardiovascular: Negative for chest pain and leg swelling.   Gastrointestinal: Negative for abdominal pain and heartburn.     Physical Exam  Vitals signs and nursing note reviewed.   Constitutional:       General: She is not in acute distress.     Appearance: She is well-developed.   HENT:      Head: Normocephalic and atraumatic.   Neck:       Vascular: No carotid bruit.   Cardiovascular:      Rate and Rhythm: Normal rate and regular rhythm.      Heart sounds: Normal heart sounds. No murmur.   Pulmonary:      Effort: Pulmonary effort is normal.      Breath sounds: Normal breath sounds. No wheezing.   Abdominal:      General: Bowel sounds are normal. There is no distension.      Palpations: Abdomen is soft. There is no mass.      Tenderness: There is no abdominal tenderness.       Visit Vitals  BP 118/76   Pulse 67   Temp 98.8 ??F (37.1 ??C) (Oral)   Resp 18   Ht 5\' 6"  (1.676 m)   Wt 228 lb (103.4 kg)   LMP 05/19/2018   SpO2 98%   BMI 36.80 kg/m??       Assessment & Plan:    ICD-10-CM ICD-9-CM    1. Essential hypertension  Well-controlled on current medication.  Continue with lifestyle changes. I10 401.9    2. Hypercholesterolemia   Well-controlled  now on statin.  Acacian. E78.00 272.0    3. Viral upper respiratory tract infection  Mucinex as needed congestion, rest, fluids, Tylenol as needed J06.9 465.9    4. Flu-like symptoms  Flu swab negative R68.89 780.99 AMB POC RAPID INFLUENZA TEST    Reviewed weight loss strategies including dietary changes and exercise.     Orders Placed This Encounter   ??? AMB POC RAPID INFLUENZA TEST   ??? metoprolol succinate (TOPROL-XL) 50 mg XL tablet   ??? atorvastatin (LIPITOR) 20 mg tablet      Follow-up 3 months  Advised her to call back or return to office if symptoms worsen/change/persist.  Discussed expected course/resolution/complications of diagnosis in detail with patient.    Medication risks/benefits/costs/interactions/alternatives discussed with patient.  She was given an after visit summary which includes diagnoses, current medications, & vitals.  She expressed understanding with the diagnosis and plan.

## 2018-06-09 NOTE — Progress Notes (Signed)
HPI:  Vanessa Mathis is a 43 y.o. year old female who returns to clinic today for follow up: Hypertension and hypercholesterolemia  HTN: BP at home 120-130/70-80's since started metoprolol.  No side effects on medication.  She is also working on lifestyle changes.  Hypercholesterolemia: She is tolerated initiation of atorvastatin with no side effects denies myalgias.  Lab work with her today which is significantly improved.  Son sick and today she has HA and feels mild sore throat.   Current Outpatient Medications   Medication Sig Dispense Refill   ??? metoprolol succinate (TOPROL-XL) 50 mg XL tablet TAKE 1 TABLET BY MOUTH DAILY 30 Tab 0   ??? hydroCHLOROthiazide (HYDRODIURIL) 25 mg tablet TAKE 1 TABLET BY MOUTH DAILY 90 Tab 1   ??? amLODIPine (NORVASC) 10 mg tablet TAKE 1 TABLET BY MOUTH DAILY 90 Tab 1   ??? atorvastatin (LIPITOR) 20 mg tablet Take 1 Tab by mouth daily. 90 Tab 0   ??? buPROPion XL (WELLBUTRIN XL) 150 mg tablet Take 1 Tab by mouth every morning. 90 Tab 1   ??? norethindrone-ethinyl estradiol (JUNEL 1/20, 21,) 1-20 mg-mcg tab Take 1 Tab by mouth daily.       Allergies   Allergen Reactions   ??? Codeine Nausea Only   ??? Lisinopril Other (comments)     Lip swelling     Social History     Tobacco Use   ??? Smoking status: Former Smoker     Last attempt to quit: 04/26/2000     Years since quitting: 18.1   ??? Smokeless tobacco: Never Used   Substance Use Topics   ??? Alcohol use: Yes     Alcohol/week: 4.0 standard drinks     Types: 2 Glasses of wine, 2 Cans of beer per week         Review of Systems   Constitutional: Negative for malaise/fatigue.   Respiratory: Negative for shortness of breath.    Cardiovascular: Negative for chest pain and leg swelling.   Gastrointestinal: Negative for abdominal pain and heartburn.     Physical Exam  Vitals signs and nursing note reviewed.   Constitutional:       General: She is not in acute distress.     Appearance: She is well-developed.   HENT:      Head: Normocephalic and atraumatic.   Neck:       Vascular: No carotid bruit.   Cardiovascular:      Rate and Rhythm: Normal rate and regular rhythm.      Heart sounds: Normal heart sounds. No murmur.   Pulmonary:      Effort: Pulmonary effort is normal.      Breath sounds: Normal breath sounds. No wheezing.   Abdominal:      General: Bowel sounds are normal. There is no distension.      Palpations: Abdomen is soft. There is no mass.      Tenderness: There is no abdominal tenderness.       Visit Vitals  BP 118/76   Pulse 67   Temp 98.8 ??F (37.1 ??C) (Oral)   Resp 18   Ht 5' 6" (1.676 m)   Wt 228 lb (103.4 kg)   LMP 05/19/2018   SpO2 98%   BMI 36.80 kg/m??       Assessment & Plan:    ICD-10-CM ICD-9-CM    1. Essential hypertension  Well-controlled on current medication.  Continue with lifestyle changes. I10 401.9    2. Hypercholesterolemia   Well-controlled   now on statin.  Acacian. E78.00 272.0    3. Viral upper respiratory tract infection  Mucinex as needed congestion, rest, fluids, Tylenol as needed J06.9 465.9    4. Flu-like symptoms  Flu swab negative R68.89 780.99 AMB POC RAPID INFLUENZA TEST    Reviewed weight loss strategies including dietary changes and exercise.     Orders Placed This Encounter   ??? AMB POC RAPID INFLUENZA TEST   ??? metoprolol succinate (TOPROL-XL) 50 mg XL tablet   ??? atorvastatin (LIPITOR) 20 mg tablet      Follow-up 3 months  Advised her to call back or return to office if symptoms worsen/change/persist.  Discussed expected course/resolution/complications of diagnosis in detail with patient.    Medication risks/benefits/costs/interactions/alternatives discussed with patient.  She was given an after visit summary which includes diagnoses, current medications, & vitals.  She expressed understanding with the diagnosis and plan.

## 2018-07-06 MED ORDER — METOPROLOL SUCCINATE SR 50 MG 24 HR TAB
50 mg | ORAL_TABLET | ORAL | 1 refills | Status: DC
Start: 2018-07-06 — End: 2018-12-05

## 2018-09-03 MED ORDER — BUPROPION XL 150 MG 24 HR TAB
150 mg | ORAL_TABLET | ORAL | 0 refills | Status: DC
Start: 2018-09-03 — End: 2018-12-02

## 2018-09-08 ENCOUNTER — Encounter: Attending: Internal Medicine

## 2018-09-08 ENCOUNTER — Telehealth: Admit: 2018-09-08 | Discharge: 2018-09-08 | Payer: PRIVATE HEALTH INSURANCE | Attending: Internal Medicine

## 2018-09-08 ENCOUNTER — Telehealth: Attending: Internal Medicine

## 2018-09-08 DIAGNOSIS — I10 Essential (primary) hypertension: Secondary | ICD-10-CM

## 2018-09-08 MED ORDER — AMLODIPINE 10 MG TAB
10 mg | ORAL_TABLET | ORAL | 1 refills | Status: DC
Start: 2018-09-08 — End: 2019-05-08

## 2018-09-08 NOTE — Progress Notes (Signed)
Vanessa Mathis is a 43 y.o. female who was seen by synchronous (real-time) audio-video technology on 09/08/2018.        Consent: Vanessa Mathis who was seen by synchronous (real-time) audio-video technology, and/or her healthcare decision maker, is aware that this patient-initiated, Telehealth encounter on 09/08/2018 is a billable service, with coverage as determined by her insurance carrier. She is aware that she may receive a bill and has provided verbal consent to proceed: Yes.     Patient identification was verified prior to start of the visit. A caregiver was present when appropriate. Due to this being a Scientist, research (medical)TeleHealth encounter (during COVID-19 public health emergency), evaluation of the following organ systems was limited: VS/Constituional/EENT/Resp/CV/GI/GU/MS/Neuro/Skin/Heme-Lymph-Imm.  Pursuant to the emergency declaration under the [facility] Act and the [employer] Emergencies Act, 1135 waiver authority and the [employer]onavirus Preparedness and [employer] Supplemental Appropriations Act, this Virtual Visit was conducted, with patient's (and/or legal guardian's) consent, to reduce the patient's risk of exposure to COVID-19 and provide necessary medical care.     Services were provided through a synchronous discussion virtually to substitute for in-person clinic visit. I was at home. The patient was at home.      Assessment & Plan:   Diagnoses and all orders for this visit:    1. Essential hypertension  Well controlled, continue current medication.   -     amLODIPine (NORVASC) 10 mg tablet; TAKE 1 TABLET BY MOUTH DAILY    2. Hypercholesterolemia  Continue current medication. Reviewed last lab work.   3. Anxiety  Well controlled, continue current medication.   4. Depression, controlled  Well controlled, continue current medication.   5. 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 Recommend elevate legs and move around more during the day. Avoid salt and try compression stockings.       Subjective:    Vanessa Mathis was seen for hypertension and hypercholesterolemia, depression and anxiety. 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  1.  Cardiovascular: We are going to copy of blood and She reports taking medications as instructed, no medication side effects noted, The home BP readings outside of office have been in the 130's / 80's range. .  Diet and Lifestyle: generally follows a low fat low cholesterol diet, generally follows a low sodium diet. Exercise: peloton and walking.   2.  Depression and anxiety: she denies depression and anxiety. No SI.  She is still seeing her counselor and has some issues with her mom. Sleeping well.       Prior to Admission medications    Medication Sig Start Date End Date Taking? Authorizing Provider   buPROPion XL (WELLBUTRIN XL) 150 mg tablet TAKE 1 TABLET BY MOUTH EVERY MORNING 09/03/18  Yes Johnsie KindredWhite, Ryker Sudbury E, MD   metoprolol succinate (TOPROL-XL) 50 mg XL tablet TAKE 1 TABLET BY MOUTH DAILY 07/06/18  Yes Johnsie KindredWhite, Virda Betters E, MD   atorvastatin (LIPITOR) 20 mg tablet Take 1 Tab by mouth daily. 06/09/18  Yes Johnsie KindredWhite, Shaterria Sager E, MD   hydroCHLOROthiazide (HYDRODIURIL) 25 mg tablet TAKE 1 TABLET BY MOUTH DAILY 05/08/18  Yes Johnsie KindredWhite, Shatira Dobosz E, MD   amLODIPine (NORVASC) 10 mg tablet TAKE 1 TABLET BY MOUTH DAILY 05/08/18  Yes Johnsie KindredWhite, Vaniah Chambers E, MD   norethindrone-ethinyl estradiol (JUNEL 1/20, 21,) 1-20 mg-mcg tab Take 1 Tab by mouth daily.   Yes Provider, Historical       Allergies   Allergen Reactions   ??? Codeine Nausea Only   ??? Lisinopril Other (comments)  Lip swelling           Review of Systems   Constitutional: Negative for malaise/fatigue.   Respiratory: Negative for shortness of breath.    Cardiovascular: Positive for leg swelling (ankles worse by the end of the day. she thinks as she has been sitting for work more. ). Negative for chest pain.   Gastrointestinal: Negative for abdominal pain and heartburn.   Neurological: Negative for dizziness and headaches.          Objective:      General: alert, cooperative, no distress   Mental  status: normal mood, behavior, speech, dress, motor activity, and thought processes, able to follow commands   Eyes: EOM intact, normal sclera   Resp: normal effort and no respiratory distress   Neuro: no gross deficits   Musculoskeletal: edema   Skin: no discoloration or lesions of concern on visible areas   Psychiatric: normal affect         We discussed the expected course, resolution and complications of the diagnosis(es) in detail.  Medication risks, benefits, costs, interactions, and alternatives were discussed as indicated.  I advised her to contact the office if her condition worsens, changes or fails to improve as anticipated. She expressed understanding with the diagnosis(es) and plan.     Johnsie Kindred, MD

## 2018-09-08 NOTE — Progress Notes (Signed)
Vanessa Mathis is a 43 y.o. female who was seen by synchronous (real-time) audio-video technology on 09/08/2018.        Consent: Vanessa Mathis who was seen by synchronous (real-time) audio-video technology, and/or her healthcare decision maker, is aware that this patient-initiated, Telehealth encounter on 09/08/2018 is a billable service, with coverage as determined by her insurance carrier. She is aware that she may receive a bill and has provided verbal consent to proceed: Yes.     Patient identification was verified prior to start of the visit. A caregiver was present when appropriate. Due to this being a TeleHealth encounter (during COVID-19 public health emergency), evaluation of the following organ systems was limited: VS/Constituional/EENT/Resp/CV/GI/GU/MS/Neuro/Skin/Heme-Lymph-Imm.  Pursuant to the emergency declaration under the Stafford Act and the National Emergencies Act, 1135 waiver authority and the Coronavirus Preparedness and Response Supplemental Appropriations Act, this Virtual Visit was conducted, with patient's (and/or legal guardian's) consent, to reduce the patient's risk of exposure to COVID-19 and provide necessary medical care.     Services were provided through a synchronous discussion virtually to substitute for in-person clinic visit. I was at home. The patient was at home.      Assessment & Plan:   Diagnoses and all orders for this visit:    1. Essential hypertension  Well controlled, continue current medication.   -     amLODIPine (NORVASC) 10 mg tablet; TAKE 1 TABLET BY MOUTH DAILY    2. Hypercholesterolemia  Continue current medication. Reviewed last lab work.   3. Anxiety  Well controlled, continue current medication.   4. Depression, controlled  Well controlled, continue current medication.   5. 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 Recommend elevate legs and move around more during the day. Avoid salt and try compression stockings.       Subjective:    Vanessa Mathis was seen for hypertension and hypercholesterolemia, depression and anxiety. 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  1.  Cardiovascular: We are going to copy of blood and She reports taking medications as instructed, no medication side effects noted, The home BP readings outside of office have been in the 130's / 80's range. .  Diet and Lifestyle: generally follows a low fat low cholesterol diet, generally follows a low sodium diet. Exercise: peloton and walking.   2.  Depression and anxiety: she denies depression and anxiety. No SI.  She is still seeing her counselor and has some issues with her mom. Sleeping well.       Prior to Admission medications    Medication Sig Start Date End Date Taking? Authorizing Provider   buPROPion XL (WELLBUTRIN XL) 150 mg tablet TAKE 1 TABLET BY MOUTH EVERY MORNING 09/03/18  Yes Cyrena Kuchenbecker E, MD   metoprolol succinate (TOPROL-XL) 50 mg XL tablet TAKE 1 TABLET BY MOUTH DAILY 07/06/18  Yes Sheketa Ende E, MD   atorvastatin (LIPITOR) 20 mg tablet Take 1 Tab by mouth daily. 06/09/18  Yes Aws Shere E, MD   hydroCHLOROthiazide (HYDRODIURIL) 25 mg tablet TAKE 1 TABLET BY MOUTH DAILY 05/08/18  Yes Jacobb Alen E, MD   amLODIPine (NORVASC) 10 mg tablet TAKE 1 TABLET BY MOUTH DAILY 05/08/18  Yes Darielys Giglia E, MD   norethindrone-ethinyl estradiol (JUNEL 1/20, 21,) 1-20 mg-mcg tab Take 1 Tab by mouth daily.   Yes Provider, Historical       Allergies   Allergen Reactions   ??? Codeine Nausea Only   ??? Lisinopril Other (comments)       Lip swelling           Review of Systems   Constitutional: Negative for malaise/fatigue.   Respiratory: Negative for shortness of breath.    Cardiovascular: Positive for leg swelling (ankles worse by the end of the day. she thinks as she has been sitting for work more. ). Negative for chest pain.   Gastrointestinal: Negative for abdominal pain and heartburn.   Neurological: Negative for dizziness and headaches.          Objective:      General: alert, cooperative, no distress   Mental  status: normal mood, behavior, speech, dress, motor activity, and thought processes, able to follow commands   Eyes: EOM intact, normal sclera   Resp: normal effort and no respiratory distress   Neuro: no gross deficits   Musculoskeletal: edema   Skin: no discoloration or lesions of concern on visible areas   Psychiatric: normal affect         We discussed the expected course, resolution and complications of the diagnosis(es) in detail.  Medication risks, benefits, costs, interactions, and alternatives were discussed as indicated.  I advised her to contact the office if her condition worsens, changes or fails to improve as anticipated. She expressed understanding with the diagnosis(es) and plan.     Johnsie Kindred, MD

## 2018-11-09 ENCOUNTER — Encounter

## 2018-11-09 MED ORDER — HYDROCHLOROTHIAZIDE 25 MG TAB
25 mg | ORAL_TABLET | ORAL | 1 refills | Status: DC
Start: 2018-11-09 — End: 2019-05-11

## 2018-12-02 MED ORDER — BUPROPION XL 150 MG 24 HR TAB
150 mg | ORAL_TABLET | ORAL | 0 refills | Status: DC
Start: 2018-12-02 — End: 2019-02-28

## 2018-12-05 MED ORDER — METOPROLOL SUCCINATE SR 50 MG 24 HR TAB
50 mg | ORAL_TABLET | ORAL | 1 refills | Status: DC
Start: 2018-12-05 — End: 2019-05-31

## 2019-01-04 MED ORDER — ATORVASTATIN 20 MG TAB
20 mg | ORAL_TABLET | ORAL | 0 refills | Status: DC
Start: 2019-01-04 — End: 2019-04-03

## 2019-02-28 MED ORDER — BUPROPION XL 150 MG 24 HR TAB
150 mg | ORAL_TABLET | ORAL | 0 refills | Status: DC
Start: 2019-02-28 — End: 2019-06-02

## 2019-03-11 ENCOUNTER — Ambulatory Visit: Attending: Internal Medicine

## 2019-04-03 MED ORDER — ATORVASTATIN 20 MG TAB
20 mg | ORAL_TABLET | ORAL | 0 refills | Status: DC
Start: 2019-04-03 — End: 2019-07-01

## 2019-05-08 ENCOUNTER — Encounter

## 2019-05-08 MED ORDER — AMLODIPINE 10 MG TAB
10 mg | ORAL_TABLET | ORAL | 0 refills | Status: DC
Start: 2019-05-08 — End: 2019-07-06

## 2019-05-11 ENCOUNTER — Encounter

## 2019-05-11 MED ORDER — HYDROCHLOROTHIAZIDE 25 MG TAB
25 mg | ORAL_TABLET | ORAL | 0 refills | Status: DC
Start: 2019-05-11 — End: 2019-06-16

## 2019-05-18 ENCOUNTER — Encounter: Attending: Internal Medicine

## 2019-05-26 NOTE — Telephone Encounter (Signed)
[facility] message sent. Called home #, mailbox full. Called Cell #, Left Message for a return phone call.

## 2019-05-26 NOTE — Telephone Encounter (Signed)
Notify patient due for follow up appointment. Virtual is fine.

## 2019-06-01 MED ORDER — METOPROLOL SUCCINATE SR 50 MG 24 HR TAB
50 mg | ORAL_TABLET | ORAL | 1 refills | Status: DC
Start: 2019-06-01 — End: 2019-07-06

## 2019-06-02 MED ORDER — BUPROPION XL 150 MG 24 HR TAB
150 mg | ORAL_TABLET | ORAL | 0 refills | Status: DC
Start: 2019-06-02 — End: 2019-07-06

## 2019-06-13 ENCOUNTER — Encounter

## 2019-06-16 MED ORDER — HYDROCHLOROTHIAZIDE 25 MG TAB
25 mg | ORAL_TABLET | ORAL | 0 refills | Status: DC
Start: 2019-06-16 — End: 2019-07-16

## 2019-07-01 MED ORDER — ATORVASTATIN 20 MG TAB
20 mg | ORAL_TABLET | ORAL | 0 refills | Status: DC
Start: 2019-07-01 — End: 2019-08-04

## 2019-07-06 ENCOUNTER — Ambulatory Visit: Admit: 2019-07-06 | Discharge: 2019-07-06 | Payer: BLUE CROSS/BLUE SHIELD | Attending: Internal Medicine

## 2019-07-06 ENCOUNTER — Ambulatory Visit: Attending: Internal Medicine

## 2019-07-06 DIAGNOSIS — I10 Essential (primary) hypertension: Secondary | ICD-10-CM

## 2019-07-06 LAB — CBC WITH AUTO DIFFERENTIAL
Basophils %: 1 % (ref 0–1)
Basophils Absolute: 0.1 10*3/uL (ref 0.0–0.1)
Eosinophils %: 2 % (ref 0–7)
Eosinophils Absolute: 0.2 10*3/uL (ref 0.0–0.4)
Granulocyte Absolute Count: 0 10*3/uL (ref 0.00–0.04)
Hematocrit: 41.1 % (ref 35.0–47.0)
Hemoglobin: 13.3 g/dL (ref 11.5–16.0)
Immature Granulocytes: 0 % (ref 0.0–0.5)
Lymphocytes %: 30 % (ref 12–49)
Lymphocytes Absolute: 2.3 10*3/uL (ref 0.8–3.5)
MCH: 29.9 PG (ref 26.0–34.0)
MCHC: 32.4 g/dL (ref 30.0–36.5)
MCV: 92.4 FL (ref 80.0–99.0)
MPV: 11.9 FL (ref 8.9–12.9)
Monocytes %: 7 % (ref 5–13)
Monocytes Absolute: 0.6 10*3/uL (ref 0.0–1.0)
NRBC Absolute: 0 10*3/uL (ref 0.00–0.01)
Neutrophils %: 60 % (ref 32–75)
Neutrophils Absolute: 4.5 10*3/uL (ref 1.8–8.0)
Nucleated RBCs: 0 PER 100 WBC
Platelets: 277 10*3/uL (ref 150–400)
RBC: 4.45 M/uL (ref 3.80–5.20)
RDW: 13 % (ref 11.5–14.5)
WBC: 7.5 10*3/uL (ref 3.6–11.0)

## 2019-07-06 LAB — COMPREHENSIVE METABOLIC PANEL
ALT: 25 U/L (ref 12–78)
AST: 15 U/L (ref 15–37)
Albumin/Globulin Ratio: 0.9 — ABNORMAL LOW (ref 1.1–2.2)
Albumin: 3.4 g/dL — ABNORMAL LOW (ref 3.5–5.0)
Alkaline Phosphatase: 78 U/L (ref 45–117)
Anion Gap: 5 mmol/L (ref 5–15)
BUN: 9 MG/DL (ref 6–20)
Bun/Cre Ratio: 11 — ABNORMAL LOW (ref 12–20)
CO2: 28 mmol/L (ref 21–32)
Calcium: 8.8 MG/DL (ref 8.5–10.1)
Chloride: 103 mmol/L (ref 97–108)
Creatinine: 0.85 MG/DL (ref 0.55–1.02)
EGFR IF NonAfrican American: >60 mL/min/{1.73_m2} (ref >60)
GFR African American: >60 mL/min/{1.73_m2} (ref >60)
Globulin: 3.7 g/dL (ref 2.0–4.0)
Glucose: 85 mg/dL (ref 65–100)
Potassium: 4 mmol/L (ref 3.5–5.1)
Sodium: 136 mmol/L (ref 136–145)
Total Bilirubin: 0.3 MG/DL (ref 0.2–1.0)
Total Protein: 7.1 g/dL (ref 6.4–8.2)

## 2019-07-06 LAB — LIPID PANEL
CHOL/HDL Ratio: 3.1 (ref 0.0–5.0)
Chol/HDL Ratio: 3.1 (ref 0.0–5.0)
Cholesterol, Total: 186 MG/DL (ref <200)
Cholesterol, total: 186 MG/DL (ref <200)
HDL Cholesterol: 60 MG/DL
HDL: 60 MG/DL
LDL Calculated: 97.6 MG/DL (ref 0–100)
LDL, calculated: 97.6 MG/DL (ref 0–100)
Triglyceride: 142 MG/DL (ref <150)
Triglycerides: 142 MG/DL (ref <150)
VLDL Cholesterol Calculated: 28.4 MG/DL
VLDL, calculated: 28.4 MG/DL

## 2019-07-06 LAB — TSH 3RD GENERATION
TSH: 2.97 u[IU]/mL (ref 0.36–3.74)
TSH: 2.97 u[IU]/mL (ref 0.36–3.74)

## 2019-07-06 LAB — HEPATITIS C ANTIBODY: Hepatitis C Ab: NONREACTIVE

## 2019-07-06 LAB — CBC WITH AUTOMATED DIFF
ABS. BASOPHILS: 0.1 10*3/uL (ref 0.0–0.1)
ABS. EOSINOPHILS: 0.2 10*3/uL (ref 0.0–0.4)
ABS. IMM. GRANS.: 0 10*3/uL (ref 0.00–0.04)
ABS. LYMPHOCYTES: 2.3 10*3/uL (ref 0.8–3.5)
ABS. MONOCYTES: 0.6 10*3/uL (ref 0.0–1.0)
ABS. NEUTROPHILS: 4.5 10*3/uL (ref 1.8–8.0)
ABSOLUTE NRBC: 0 10*3/uL (ref 0.00–0.01)
BASOPHILS: 1 % (ref 0–1)
EOSINOPHILS: 2 % (ref 0–7)
HCT: 41.1 % (ref 35.0–47.0)
HGB: 13.3 g/dL (ref 11.5–16.0)
IMMATURE GRANULOCYTES: 0 % (ref 0.0–0.5)
LYMPHOCYTES: 30 % (ref 12–49)
MCH: 29.9 PG (ref 26.0–34.0)
MCHC: 32.4 g/dL (ref 30.0–36.5)
MCV: 92.4 FL (ref 80.0–99.0)
MONOCYTES: 7 % (ref 5–13)
MPV: 11.9 FL (ref 8.9–12.9)
NEUTROPHILS: 60 % (ref 32–75)
NRBC: 0 PER 100 WBC
PLATELET: 277 10*3/uL (ref 150–400)
RBC: 4.45 M/uL (ref 3.80–5.20)
RDW: 13 % (ref 11.5–14.5)
WBC: 7.5 10*3/uL (ref 3.6–11.0)

## 2019-07-06 LAB — METABOLIC PANEL, COMPREHENSIVE
A-G Ratio: 0.9 — ABNORMAL LOW (ref 1.1–2.2)
ALT (SGPT): 25 U/L (ref 12–78)
AST (SGOT): 15 U/L (ref 15–37)
Albumin: 3.4 g/dL — ABNORMAL LOW (ref 3.5–5.0)
Alk. phosphatase: 78 U/L (ref 45–117)
Anion gap: 5 mmol/L (ref 5–15)
BUN/Creatinine ratio: 11 — ABNORMAL LOW (ref 12–20)
BUN: 9 MG/DL (ref 6–20)
Bilirubin, total: 0.3 MG/DL (ref 0.2–1.0)
CO2: 28 mmol/L (ref 21–32)
Calcium: 8.8 MG/DL (ref 8.5–10.1)
Chloride: 103 mmol/L (ref 97–108)
Creatinine: 0.85 MG/DL (ref 0.55–1.02)
GFR est AA: >60 mL/min/{1.73_m2} (ref >60)
GFR est non-AA: >60 mL/min/{1.73_m2} (ref >60)
Globulin: 3.7 g/dL (ref 2.0–4.0)
Glucose: 85 mg/dL (ref 65–100)
Potassium: 4 mmol/L (ref 3.5–5.1)
Protein, total: 7.1 g/dL (ref 6.4–8.2)
Sodium: 136 mmol/L (ref 136–145)

## 2019-07-06 LAB — HEPATITIS C AB: Hep C virus Ab Interp.: NONREACTIVE

## 2019-07-06 MED ORDER — BUPROPION XL 150 MG 24 HR TAB
150 mg | ORAL_TABLET | ORAL | 1 refills | Status: DC
Start: 2019-07-06 — End: 2019-08-31

## 2019-07-06 MED ORDER — METOPROLOL SUCCINATE SR 50 MG 24 HR TAB
50 mg | ORAL_TABLET | ORAL | 1 refills | Status: DC
Start: 2019-07-06 — End: 2019-12-20

## 2019-07-06 MED ORDER — AMLODIPINE 10 MG TAB
10 mg | ORAL_TABLET | ORAL | 1 refills | Status: DC
Start: 2019-07-06 — End: 2020-01-26

## 2019-07-06 NOTE — Assessment & Plan Note (Signed)
Well controlled, continue current medication.

## 2019-07-06 NOTE — Assessment & Plan Note (Signed)
Well controlled, continue current medication.   Lifestyle changes.  Wt loss strategies and also riding her peloton.

## 2019-07-06 NOTE — Addendum Note (Signed)
Addendum  Note by Alois Cliche at 07/06/19 0815                Author: Alois Cliche  Service: --  Author Type: Technician       Filed: 07/06/19 0913  Encounter Date: 07/06/2019  Status: Signed          Editor: Alois Cliche (Technician)          Addended by: Alois Cliche on: 07/06/2019 09:13 AM    Modules accepted: Orders

## 2019-07-06 NOTE — Progress Notes (Signed)
Vanessa Mathis is a 44 y.o. female who was seen today for a follow-up visit.     Assessment & Plan:   Diagnoses and all orders for this visit:    1. Essential hypertension  Assessment & Plan:  Well controlled, continue current medication.       2. Hypercholesterolemia  Assessment & Plan:  Well controlled, continue current medication.   Lifestyle changes.  Wt loss strategies and also riding her peloton.    Orders:  -     LIPID PANEL; Future  -     METABOLIC PANEL, COMPREHENSIVE; Future  -     TSH 3RD GENERATION; Future    3. Anxiety  Assessment & Plan:  Well controlled, continue current medication.       4. Depression, controlled  Assessment & Plan:  Well controlled, continue current medication.       5. Screening for deficiency anemia  -     CBC WITH AUTOMATED DIFF; Future    6. Need for hepatitis C screening test  -     HEPATITIS C AB; Future      .       Subjective:   Vanessa Mathis was seen for for hypertension and hypercholesterolemia, depression and anxiety.   1.  Cardiovascular:  She reports taking medications as instructed, no medication side effects noted, The home BP readings outside of office have been in the 120's / 80's range. .  Diet and Lifestyle: generally follows a low fat low cholesterol diet, generally follows a low sodium diet. Exercise: peloton and walking.   2.  Depression and anxiety: she denies depression and anxiety. No SI.  She is still seeing her counselor.  Sleeping well.       Review of Systems   Respiratory: Negative for shortness of breath.    Cardiovascular: Negative for chest pain and leg swelling.   Gastrointestinal: Negative for abdominal pain.    - per HPI    Physical Exam  Vitals signs and nursing note reviewed.   Constitutional:       General: She is not in acute distress.     Appearance: She is well-developed.   HENT:      Head: Normocephalic and atraumatic.   Neck:      Thyroid: No thyromegaly.   Cardiovascular:      Rate and Rhythm: Normal rate and regular rhythm.    Pulmonary:      Effort: Pulmonary effort is normal.      Breath sounds: Normal breath sounds.   Abdominal:      General: Bowel sounds are normal.      Palpations: Abdomen is soft.      Tenderness: There is no abdominal tenderness.       Visit Vitals  BP 117/82 (BP 1 Location: Right arm, BP Patient Position: Sitting, BP Cuff Size: Large adult)   Pulse 71   Temp 97.6 ??F (36.4 ??C) (Temporal)   Resp 16   Ht 5\' 6"  (1.676 m)   Wt 245 lb 9.6 oz (111.4 kg)   LMP 06/24/2019   SpO2 98%   BMI 39.64 kg/m??        Aspects of this note may have been generated using voice recognition software. Despite editing, there may be some syntax errors   I have discussed the diagnosis with the patient and the intended plan as seen in the above orders.  The patient has received an after-visit summary and questions were answered concerning future plans.  I have discussed any recommended medication side effects and warnings with the patient as well.  She has expressed understanding of the diagnosis and plan    Gloriajean Dell, MD

## 2019-07-06 NOTE — Progress Notes (Signed)
Results released to patient via MyChart.  All labs are stable or at goal for her.

## 2019-07-06 NOTE — Progress Notes (Signed)
Vanessa Mathis is a 44 y.o. female who was seen today for a follow-up visit.     Assessment & Plan:   Diagnoses and all orders for this visit:    1. Essential hypertension  Assessment & Plan:  Well controlled, continue current medication.       2. Hypercholesterolemia  Assessment & Plan:  Well controlled, continue current medication.   Lifestyle changes.  Wt loss strategies and also riding her peloton.    Orders:  -     LIPID PANEL; Future  -     METABOLIC PANEL, COMPREHENSIVE; Future  -     TSH 3RD GENERATION; Future    3. Anxiety  Assessment & Plan:  Well controlled, continue current medication.       4. Depression, controlled  Assessment & Plan:  Well controlled, continue current medication.       5. Screening for deficiency anemia  -     CBC WITH AUTOMATED DIFF; Future    6. Need for hepatitis C screening test  -     HEPATITIS C AB; Future      .       Subjective:   Margret E Nienaber was seen for for hypertension and hypercholesterolemia, depression and anxiety.   1.  Cardiovascular:  She reports taking medications as instructed, no medication side effects noted, The home BP readings outside of office have been in the 120's / 80's range. .  Diet and Lifestyle: generally follows a low fat low cholesterol diet, generally follows a low sodium diet. Exercise: peloton and walking.   2.  Depression and anxiety: she denies depression and anxiety. No SI.  She is still seeing her counselor.  Sleeping well.       Review of Systems   Respiratory: Negative for shortness of breath.    Cardiovascular: Negative for chest pain and leg swelling.   Gastrointestinal: Negative for abdominal pain.    - per HPI    Physical Exam  Vitals signs and nursing note reviewed.   Constitutional:       General: She is not in acute distress.     Appearance: She is well-developed.   HENT:      Head: Normocephalic and atraumatic.   Neck:      Thyroid: No thyromegaly.   Cardiovascular:      Rate and Rhythm: Normal rate and regular rhythm.    Pulmonary:      Effort: Pulmonary effort is normal.      Breath sounds: Normal breath sounds.   Abdominal:      General: Bowel sounds are normal.      Palpations: Abdomen is soft.      Tenderness: There is no abdominal tenderness.       Visit Vitals  BP 117/82 (BP 1 Location: Right arm, BP Patient Position: Sitting, BP Cuff Size: Large adult)   Pulse 71   Temp 97.6 ??F (36.4 ??C) (Temporal)   Resp 16   Ht 5' 6" (1.676 m)   Wt 245 lb 9.6 oz (111.4 kg)   LMP 06/24/2019   SpO2 98%   BMI 39.64 kg/m??        Aspects of this note may have been generated using voice recognition software. Despite editing, there may be some syntax errors   I have discussed the diagnosis with the patient and the intended plan as seen in the above orders.  The patient has received an after-visit summary and questions were answered concerning future plans.    I have discussed any recommended medication side effects and warnings with the patient as well.  She has expressed understanding of the diagnosis and plan    Gloriajean Dell, MD

## 2019-07-06 NOTE — Addendum Note (Signed)
Addended by: Alois Cliche on: 07/06/2019 09:13 AM     Modules accepted: Orders

## 2019-07-06 NOTE — Assessment & Plan Note (Addendum)
Well controlled, continue current medication.   Lifestyle changes.  Wt loss strategies and also riding her peloton.

## 2019-07-16 ENCOUNTER — Encounter

## 2019-07-17 MED ORDER — HYDROCHLOROTHIAZIDE 25 MG TAB
25 mg | ORAL_TABLET | ORAL | 5 refills | Status: AC
Start: 2019-07-17 — End: ?

## 2019-08-04 MED ORDER — ATORVASTATIN 20 MG TAB
20 mg | ORAL_TABLET | ORAL | 1 refills | Status: DC
Start: 2019-08-04 — End: 2020-01-26

## 2019-08-12 ENCOUNTER — Encounter

## 2019-08-31 MED ORDER — BUPROPION XL 150 MG 24 HR TAB
150 mg | ORAL_TABLET | ORAL | 1 refills | Status: DC
Start: 2019-08-31 — End: 2020-02-23

## 2019-12-20 MED ORDER — METOPROLOL SUCCINATE SR 50 MG 24 HR TAB
50 mg | ORAL_TABLET | ORAL | 0 refills | Status: AC
Start: 2019-12-20 — End: ?

## 2019-12-20 NOTE — Telephone Encounter (Signed)
Notify patient due for follow up appointment.

## 2020-01-22 ENCOUNTER — Encounter

## 2020-01-26 MED ORDER — AMLODIPINE 10 MG TAB
10 mg | ORAL_TABLET | ORAL | 1 refills | Status: AC
Start: 2020-01-26 — End: ?

## 2020-01-26 MED ORDER — ATORVASTATIN 20 MG TAB
20 mg | ORAL_TABLET | ORAL | 1 refills | Status: AC
Start: 2020-01-26 — End: ?

## 2020-02-23 MED ORDER — BUPROPION XL 150 MG 24 HR TAB
150 mg | ORAL_TABLET | ORAL | 0 refills | Status: AC
Start: 2020-02-23 — End: ?

## 2020-03-14 ENCOUNTER — Encounter

## 2020-03-15 NOTE — Telephone Encounter (Signed)
Call patient as we have been receiving a refill request and I do not believe I am her PCP any longer.  I am no longer listed on her chart.

## 2020-03-15 NOTE — Telephone Encounter (Signed)
Patient has confirmed no longer under provider care.  Moved to [place].
